# Patient Record
Sex: Female | Born: 2000 | Race: Black or African American | Hispanic: No | Marital: Single | Smoking: Never smoker
Health system: Southern US, Community
[De-identification: ages and names within clinical notes are randomized; demographics above are authoritative.]

## PROBLEM LIST (undated history)

## (undated) DIAGNOSIS — J45909 Unspecified asthma, uncomplicated: Secondary | ICD-10-CM

## (undated) HISTORY — PX: OVARIAN CYST REMOVAL: SHX89

---

## 2021-10-15 ENCOUNTER — Other Ambulatory Visit: Payer: Self-pay

## 2021-10-15 ENCOUNTER — Encounter (HOSPITAL_BASED_OUTPATIENT_CLINIC_OR_DEPARTMENT_OTHER): Payer: Self-pay | Admitting: Emergency Medicine

## 2021-10-15 ENCOUNTER — Emergency Department (HOSPITAL_BASED_OUTPATIENT_CLINIC_OR_DEPARTMENT_OTHER): Payer: Medicaid Other

## 2021-10-15 ENCOUNTER — Emergency Department (HOSPITAL_BASED_OUTPATIENT_CLINIC_OR_DEPARTMENT_OTHER)
Admission: EM | Admit: 2021-10-15 | Discharge: 2021-10-15 | Disposition: A | Discharge status: Home / Self Care | Payer: Medicaid Other | Attending: Emergency Medicine | Admitting: Emergency Medicine

## 2021-10-15 DIAGNOSIS — W19XXXA Unspecified fall, initial encounter: Secondary | ICD-10-CM | POA: Insufficient documentation

## 2021-10-15 DIAGNOSIS — S4991XA Unspecified injury of right shoulder and upper arm, initial encounter: Secondary | ICD-10-CM | POA: Diagnosis present

## 2021-10-15 DIAGNOSIS — Y9368 Activity, volleyball (beach) (court): Secondary | ICD-10-CM | POA: Insufficient documentation

## 2021-10-15 DIAGNOSIS — S46911A Strain of unspecified muscle, fascia and tendon at shoulder and upper arm level, right arm, initial encounter: Secondary | ICD-10-CM | POA: Insufficient documentation

## 2021-10-15 DIAGNOSIS — Y92219 Unspecified school as the place of occurrence of the external cause: Secondary | ICD-10-CM | POA: Insufficient documentation

## 2021-10-15 HISTORY — DX: Unspecified asthma, uncomplicated: J45.909

## 2021-10-15 MED ORDER — NAPROXEN 375 MG PO TABS: 375.0000 mg | ORAL_TABLET | Freq: Two times a day (BID) | ORAL | 0 refills | Status: AC

## 2021-10-15 MED ORDER — HYDROCODONE-ACETAMINOPHEN 5-325 MG PO TABS
1.0000 | ORAL_TABLET | Freq: Once | ORAL | Status: AC
  Administered 2021-10-15: 1 via ORAL
  Filled 2021-10-15: qty 1

## 2021-10-15 MED ORDER — HYDROCODONE-ACETAMINOPHEN 5-325 MG PO TABS: 1.0000 | ORAL_TABLET | Freq: Four times a day (QID) | ORAL | 0 refills | Status: AC | PRN

## 2021-10-15 NOTE — Discharge Instructions (Addendum)
Your x-ray did not show any evidence of acute fracture or dislocation.  You likely have a shoulder strain.  You received a sling in the emergency room.  You can use this for comfort.  However I do recommend that you take it out several times a day and do range of motion exercises.  I have also given you a referral to sports medicine.  I have given you a few doses of pain medication however I recommend you primarily use naproxen the anti-inflammatory medication as well as icing the area.  If you have any worsening or concerning symptoms you can return for evaluation otherwise follow-up with the sports med doc and your primary care provider as needed.

## 2021-10-15 NOTE — ED Notes (Signed)
Pt A&OX4 ambulatory at d/c with independent steady gait. Pt verbalized understanding of d/c instructions, prescriptions and follow up care. D/C instructions printed without referral info for sports medicine doctor, was told it needed to be reprinted to give her the information but pt declined stating she didn't need it.

## 2021-10-15 NOTE — ED Notes (Signed)
Pt called out for pain medication. EDP informed.

## 2021-10-15 NOTE — ED Provider Notes (Signed)
[facility] EMERGENCY DEPARTMENT Provider Note   CSN: [id number] Arrival date & time: 10/15/21  2100     History  Chief Complaint  Patient presents with   Shoulder Injury    Regina Frye is a 21 y.o. female.  21 year old female presents today for evaluation of right shoulder pain following falling Friday.  [place].  States severe pain since then.  Has been taking Tylenol with no relief.  Has history of injury to the same shoulder when playing volleyball in school.  The history is provided by the patient. No language interpreter was used.      Home Medications Prior to Admission medications   Medication Sig Start Date End Date Taking? Authorizing Provider  HYDROcodone-acetaminophen (NORCO/VICODIN) 5-325 MG tablet Take 1 tablet by mouth every 6 (six) hours as needed. 10/15/21  Yes Charleene Callegari, PA-C  naproxen (NAPROSYN) 375 MG tablet Take 1 tablet (375 mg total) by mouth 2 (two) times daily. 10/15/21  Yes Marita Kansas, PA-C      Allergies    Patient has no known allergies.    Review of Systems   Review of Systems  Constitutional:  Negative for fever.  Musculoskeletal:  Positive for arthralgias. Negative for joint swelling.  Skin:  Negative for wound.  All other systems reviewed and are negative.  Physical Exam Updated Vital Signs BP 113/73 (BP Location: Left Arm)   Pulse (!) 104   Temp 98.6 F (37 C) (Oral)   Resp 16   Ht 5\' 6"  (1.676 m)   Wt 66.8 kg   SpO2 98%   BMI 23.77 kg/m  Physical Exam Vitals and nursing note reviewed.  Constitutional:      General: She is not in acute distress.    Appearance: Normal appearance. She is not ill-appearing.  HENT:     Head: Normocephalic and atraumatic.     Nose: Nose normal.  Eyes:     Conjunctiva/sclera: Conjunctivae normal.  Pulmonary:     Effort: Pulmonary effort is normal. No respiratory distress.  Musculoskeletal:        General: No deformity.     Comments: Diffuse right  shoulder tenderness to palpation present.  Limited range of motion secondary to pain.  Without deformity or swelling.  2+ radial pulse present.  Sensation intact.  Full range of motion in the elbow and wrist.  Cervical spine without tenderness to palpation.  Skin:    Findings: No rash.  Neurological:     Mental Status: She is alert.    ED Results / Procedures / Treatments   Labs (all labs ordered are listed, but only abnormal results are displayed) Labs Reviewed - No data to display  EKG None  Radiology DG Shoulder Right  Result Date: 10/15/2021 CLINICAL DATA:  Right shoulder injury.  Fall. EXAM: RIGHT SHOULDER - 2+ VIEW COMPARISON:  None Available. FINDINGS: There is no evidence of fracture or dislocation. There is no evidence of arthropathy or other focal bone abnormality. Soft tissues are unremarkable. IMPRESSION: Negative. Electronically Signed   By: 10/17/2021 M.D.   On: 10/15/2021 21:39    Procedures Procedures    Medications Ordered in ED Medications  HYDROcodone-acetaminophen (NORCO/VICODIN) 5-325 MG per tablet 1 tablet (1 tablet Oral Given 10/15/21 2251)    ED Course/ Medical Decision Making/ A&P                           Medical  Decision Making Amount and/or Complexity of Data Reviewed Radiology: ordered.  Risk Prescription drug management.   21 year old female presents today for evaluation of right shoulder pain following a fall that occurred on Friday.  X-ray without evidence of fracture.  She likely has a muscle strain.  She has severe pain on exam.  Limited range of motion secondary to pain.  Sling provided.  Pain medication provided.  Return precautions discussed.  Sports medicine referral provided.   Final Clinical Impression(s) / ED Diagnoses Final diagnoses:  Strain of right shoulder, initial encounter    Rx / DC Orders ED Discharge Orders          Ordered    HYDROcodone-acetaminophen (NORCO/VICODIN) 5-325 MG tablet  Every 6 hours PRN         10/15/21 2316    naproxen (NAPROSYN) 375 MG tablet  2 times daily        10/15/21 2316              Marita Kansas, PA-C 10/15/21 2319    Tegeler, Canary Brim, MD 10/16/21 [phone]

## 2021-10-15 NOTE — ED Notes (Signed)
Sling placed onto patient's right arm

## 2021-10-15 NOTE — ED Triage Notes (Signed)
Patient reports she fell on her right shoulder, c/o pain with movement.

## 2021-10-31 ENCOUNTER — Emergency Department (HOSPITAL_BASED_OUTPATIENT_CLINIC_OR_DEPARTMENT_OTHER)
Admission: EM | Admit: 2021-10-31 | Discharge: 2021-10-31 | Discharge status: Against Medical Advice | Payer: Medicaid Other | Attending: Emergency Medicine | Admitting: Emergency Medicine

## 2021-10-31 ENCOUNTER — Encounter (HOSPITAL_BASED_OUTPATIENT_CLINIC_OR_DEPARTMENT_OTHER): Payer: Self-pay | Admitting: Emergency Medicine

## 2021-10-31 DIAGNOSIS — Z5321 Procedure and treatment not carried out due to patient leaving prior to being seen by health care provider: Secondary | ICD-10-CM | POA: Insufficient documentation

## 2021-10-31 DIAGNOSIS — R42 Dizziness and giddiness: Secondary | ICD-10-CM | POA: Insufficient documentation

## 2021-10-31 DIAGNOSIS — N939 Abnormal uterine and vaginal bleeding, unspecified: Secondary | ICD-10-CM | POA: Diagnosis not present

## 2021-10-31 DIAGNOSIS — R1030 Lower abdominal pain, unspecified: Secondary | ICD-10-CM | POA: Diagnosis present

## 2021-10-31 LAB — URINALYSIS, MICROSCOPIC (REFLEX)

## 2021-10-31 LAB — LIPASE, BLOOD: Lipase: 71 U/L — ABNORMAL HIGH (ref 11–51)

## 2021-10-31 LAB — PREGNANCY, URINE: Preg Test, Ur: NEGATIVE

## 2021-10-31 LAB — CBC
HCT: 38.8 % (ref 36.0–46.0)
Hemoglobin: 13.5 g/dL (ref 12.0–15.0)
MCH: 31.5 pg (ref 26.0–34.0)
MCHC: 34.8 g/dL (ref 30.0–36.0)
MCV: 90.4 fL (ref 80.0–100.0)
Platelets: 242 10*3/uL (ref 150–400)
RBC: 4.29 MIL/uL (ref 3.87–5.11)
RDW: 12.6 % (ref 11.5–15.5)
WBC: 5.7 10*3/uL (ref 4.0–10.5)
nRBC: 0 % (ref 0.0–0.2)

## 2021-10-31 LAB — COMPREHENSIVE METABOLIC PANEL
ALT: 30 U/L (ref 0–44)
AST: 24 U/L (ref 15–41)
Albumin: 4.2 g/dL (ref 3.5–5.0)
Alkaline Phosphatase: 54 U/L (ref 38–126)
Anion gap: 5 (ref 5–15)
BUN: 12 mg/dL (ref 6–20)
CO2: 23 mmol/L (ref 22–32)
Calcium: 9.2 mg/dL (ref 8.9–10.3)
Chloride: 111 mmol/L (ref 98–111)
Creatinine, Ser: 0.94 mg/dL (ref 0.44–1.00)
GFR, Estimated: >60 mL/min (ref >60)
Glucose, Bld: 94 mg/dL (ref 70–99)
Potassium: 3.8 mmol/L (ref 3.5–5.1)
Sodium: 139 mmol/L (ref 135–145)
Total Bilirubin: 0.6 mg/dL (ref 0.3–1.2)
Total Protein: 7.4 g/dL (ref 6.5–8.1)

## 2021-10-31 LAB — URINALYSIS, ROUTINE W REFLEX MICROSCOPIC
Bilirubin Urine: NEGATIVE
Glucose, UA: NEGATIVE mg/dL
Ketones, ur: NEGATIVE mg/dL
Leukocytes,Ua: NEGATIVE
Nitrite: NEGATIVE
Protein, ur: NEGATIVE mg/dL
Specific Gravity, Urine: 1.025 (ref 1.005–1.030)
pH: 6.5 (ref 5.0–8.0)

## 2021-10-31 NOTE — ED Triage Notes (Signed)
Pt arrives pov, steady gait, c/o lower abdominal pain with n/v since last night. Felt better upon waking, then returned after nap pta. Also reports vaginal bleeding. Denies fever, reports being light headed

## 2021-10-31 NOTE — ED Notes (Signed)
Patient approached nurses station and stated she was leaving due to extended wait time. RN informed pt the PA had signed up for her and should be in the room shortly. RN encouraged pt to stay and been seen. Pt persistent about wanting to leave. RN informed pt to return if sx worsen. Pt verbalized understanding. Pt ambulatory to ED exit with steady gait. NAD.

## 2022-01-17 ENCOUNTER — Emergency Department (HOSPITAL_BASED_OUTPATIENT_CLINIC_OR_DEPARTMENT_OTHER): Payer: Medicaid Other

## 2022-01-17 ENCOUNTER — Emergency Department (HOSPITAL_BASED_OUTPATIENT_CLINIC_OR_DEPARTMENT_OTHER)
Admission: EM | Admit: 2022-01-17 | Discharge: 2022-01-17 | Disposition: A | Discharge status: Home / Self Care | Payer: Medicaid Other | Attending: Emergency Medicine | Admitting: Emergency Medicine

## 2022-01-17 ENCOUNTER — Encounter (HOSPITAL_BASED_OUTPATIENT_CLINIC_OR_DEPARTMENT_OTHER): Payer: Self-pay

## 2022-01-17 ENCOUNTER — Other Ambulatory Visit: Payer: Self-pay

## 2022-01-17 DIAGNOSIS — G8929 Other chronic pain: Secondary | ICD-10-CM | POA: Insufficient documentation

## 2022-01-17 DIAGNOSIS — J45909 Unspecified asthma, uncomplicated: Secondary | ICD-10-CM | POA: Insufficient documentation

## 2022-01-17 DIAGNOSIS — R519 Headache, unspecified: Secondary | ICD-10-CM | POA: Diagnosis not present

## 2022-01-17 DIAGNOSIS — R197 Diarrhea, unspecified: Secondary | ICD-10-CM | POA: Insufficient documentation

## 2022-01-17 DIAGNOSIS — Z20822 Contact with and (suspected) exposure to covid-19: Secondary | ICD-10-CM | POA: Diagnosis not present

## 2022-01-17 DIAGNOSIS — G8921 Chronic pain due to trauma: Secondary | ICD-10-CM

## 2022-01-17 DIAGNOSIS — R112 Nausea with vomiting, unspecified: Secondary | ICD-10-CM | POA: Insufficient documentation

## 2022-01-17 LAB — URINALYSIS, ROUTINE W REFLEX MICROSCOPIC
Bilirubin Urine: NEGATIVE
Glucose, UA: NEGATIVE mg/dL
Hgb urine dipstick: NEGATIVE
Ketones, ur: NEGATIVE mg/dL
Leukocytes,Ua: NEGATIVE
Nitrite: NEGATIVE
Protein, ur: NEGATIVE mg/dL
Specific Gravity, Urine: 1.02 (ref 1.005–1.030)
pH: 7 (ref 5.0–8.0)

## 2022-01-17 LAB — COMPREHENSIVE METABOLIC PANEL
ALT: 18 U/L (ref 0–44)
AST: 22 U/L (ref 15–41)
Albumin: 4.1 g/dL (ref 3.5–5.0)
Alkaline Phosphatase: 54 U/L (ref 38–126)
Anion gap: 6 (ref 5–15)
BUN: 10 mg/dL (ref 6–20)
CO2: 25 mmol/L (ref 22–32)
Calcium: 9.4 mg/dL (ref 8.9–10.3)
Chloride: 111 mmol/L (ref 98–111)
Creatinine, Ser: 0.86 mg/dL (ref 0.44–1.00)
GFR, Estimated: >60 mL/min (ref >60)
Glucose, Bld: 80 mg/dL (ref 70–99)
Potassium: 4 mmol/L (ref 3.5–5.1)
Sodium: 142 mmol/L (ref 135–145)
Total Bilirubin: 0.4 mg/dL (ref 0.3–1.2)
Total Protein: 7 g/dL (ref 6.5–8.1)

## 2022-01-17 LAB — CBC
HCT: 39.3 % (ref 36.0–46.0)
Hemoglobin: 13.3 g/dL (ref 12.0–15.0)
MCH: 30.9 pg (ref 26.0–34.0)
MCHC: 33.8 g/dL (ref 30.0–36.0)
MCV: 91.4 fL (ref 80.0–100.0)
Platelets: 236 10*3/uL (ref 150–400)
RBC: 4.3 MIL/uL (ref 3.87–5.11)
RDW: 13.1 % (ref 11.5–15.5)
WBC: 5.4 10*3/uL (ref 4.0–10.5)
nRBC: 0 % (ref 0.0–0.2)

## 2022-01-17 LAB — SARS CORONAVIRUS 2 BY RT PCR: SARS Coronavirus 2 by RT PCR: NEGATIVE

## 2022-01-17 LAB — PREGNANCY, URINE: Preg Test, Ur: NEGATIVE

## 2022-01-17 LAB — LIPASE, BLOOD: Lipase: 45 U/L (ref 11–51)

## 2022-01-17 MED ORDER — ONDANSETRON HCL 4 MG/2ML IJ SOLN
4.0000 mg | Freq: Once | INTRAMUSCULAR | Status: AC
Start: 2022-01-17 — End: 2022-01-17
  Administered 2022-01-17: 4 mg via INTRAVENOUS
  Filled 2022-01-17: qty 2

## 2022-01-17 MED ORDER — SODIUM CHLORIDE 0.9 % IV SOLN: INTRAVENOUS | Status: DC

## 2022-01-17 MED ORDER — SODIUM CHLORIDE 0.9 % IV BOLUS
1000.0000 mL | Freq: Once | INTRAVENOUS | Status: AC
  Administered 2022-01-17: 1000 mL via INTRAVENOUS

## 2022-01-17 MED ORDER — HYDROMORPHONE HCL 1 MG/ML IJ SOLN
1.0000 mg | Freq: Once | INTRAMUSCULAR | Status: AC
  Administered 2022-01-17: 1 mg via INTRAVENOUS
  Filled 2022-01-17: qty 1
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
## 2022-01-17 NOTE — ED Notes (Signed)
Patient reported left side pain  RN notified

## 2022-01-17 NOTE — Discharge Instructions (Signed)
Follow-up back up with your OB/GYN.  Also follow-up with neurology as scheduled.  Today's labs without any significant abnormality which is very reassuring.  COVID testing done followed up on MyChart.  As we discussed your symptoms are not really related to COVID but sometimes people have strange symptoms or other asymptomatic with it.  Work note provided.
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
## 2022-10-29 ENCOUNTER — Other Ambulatory Visit: Payer: Self-pay

## 2022-10-29 ENCOUNTER — Emergency Department (HOSPITAL_BASED_OUTPATIENT_CLINIC_OR_DEPARTMENT_OTHER)
Admission: EM | Admit: 2022-10-29 | Discharge: 2022-10-29 | Discharge status: Against Medical Advice | Payer: 59 | Attending: Emergency Medicine | Admitting: Emergency Medicine

## 2022-10-29 ENCOUNTER — Encounter (HOSPITAL_BASED_OUTPATIENT_CLINIC_OR_DEPARTMENT_OTHER): Payer: Self-pay

## 2022-10-29 DIAGNOSIS — R109 Unspecified abdominal pain: Secondary | ICD-10-CM | POA: Diagnosis present

## 2022-10-29 DIAGNOSIS — Z5321 Procedure and treatment not carried out due to patient leaving prior to being seen by health care provider: Secondary | ICD-10-CM | POA: Insufficient documentation

## 2022-10-29 DIAGNOSIS — R519 Headache, unspecified: Secondary | ICD-10-CM | POA: Insufficient documentation

## 2022-10-29 DIAGNOSIS — R42 Dizziness and giddiness: Secondary | ICD-10-CM | POA: Insufficient documentation

## 2022-10-29 DIAGNOSIS — R112 Nausea with vomiting, unspecified: Secondary | ICD-10-CM | POA: Diagnosis not present

## 2022-10-29 NOTE — ED Notes (Signed)
Pt states she was going out to check if her brother was here before we drew her blood, got in the car and left.
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
## 2022-10-29 NOTE — ED Notes (Signed)
PER EMS: pt is reporting headache, nausea and dizziness. She received Zofran at [facility] today and reports it helped with her nausea and vomiting. She left AMA because of the wait time.   BP- 120/82, HR-80, 99% RA, CBG-90

## 2023-03-25 LAB — HM PAP SMEAR
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
## 2023-05-25 NOTE — Telephone Encounter (Signed)
 Patient is aware that someone will call her to schedule an appt for hospital f/u.

## 2023-05-25 NOTE — Telephone Encounter (Signed)
 Patient needs a hospital/ED follow up appointment.  Requested timeframe is as soon as possible.  No availability was found.  Please call with appointment information.  [facility] [place]   Treated qnm:[id number]   Discharge date: ER 01/03   Call back number: [phone]

## 2023-10-12 IMAGING — DX DG SHOULDER 2+V*R*
3 series · 3 of 3 positions shown · non-contrast
Comparison: None Available.

CLINICAL DATA: Right shoulder injury.  Fall.

EXAM:
RIGHT SHOULDER - 2+ VIEW

[shoulder grashey]
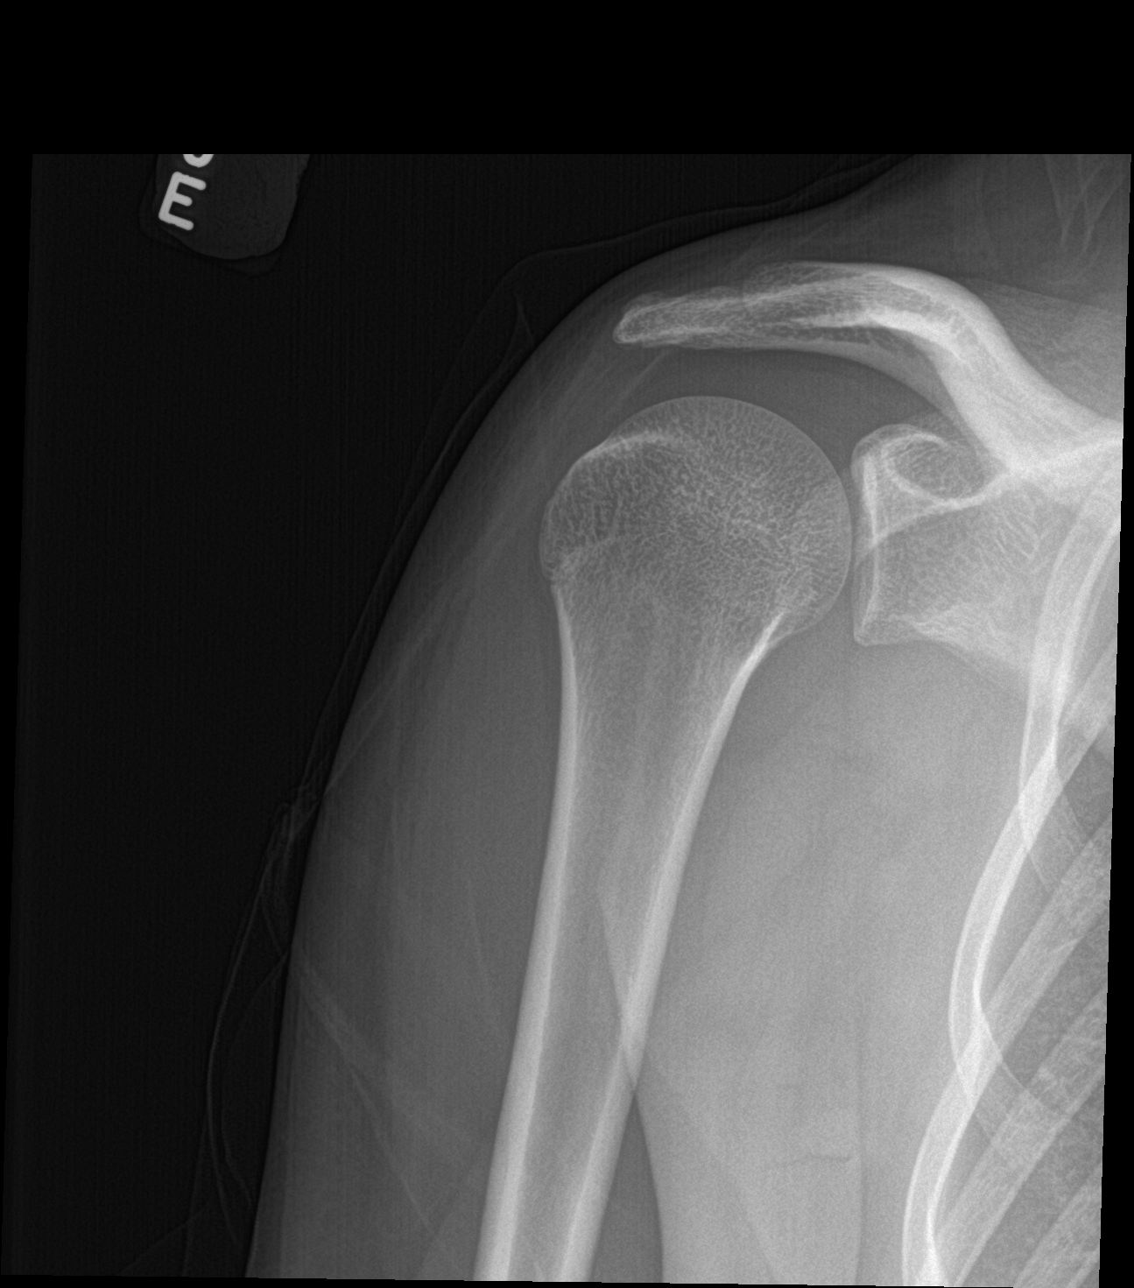

[shoulder y view]
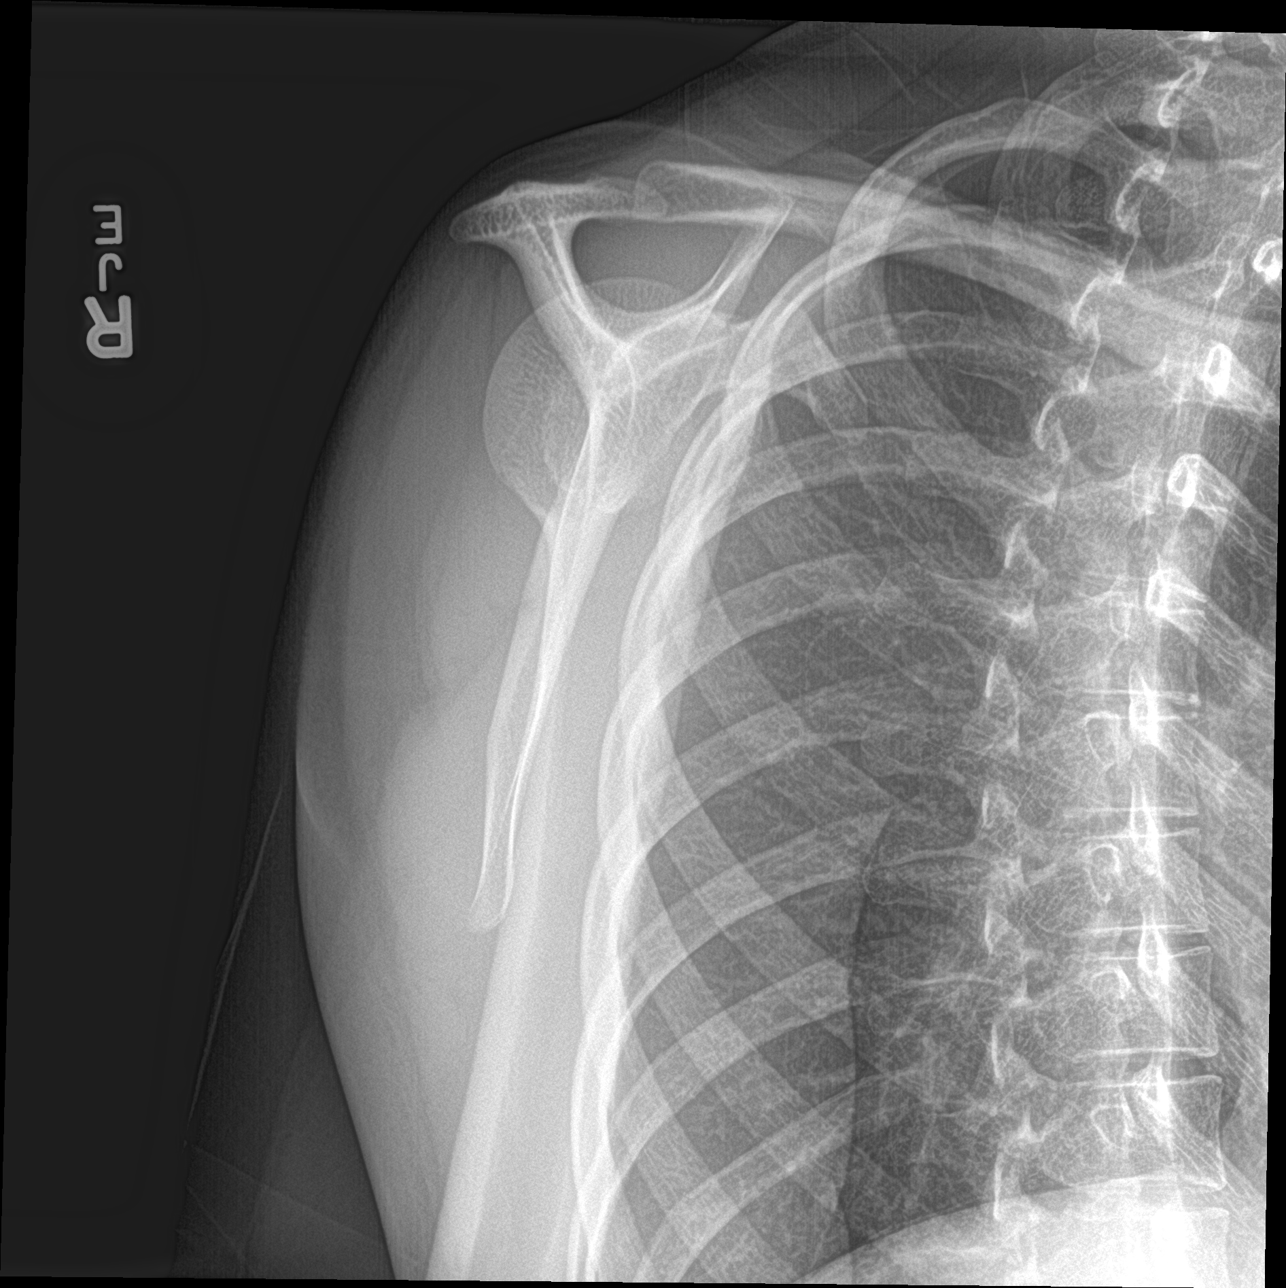

[shoulder ap neutral]
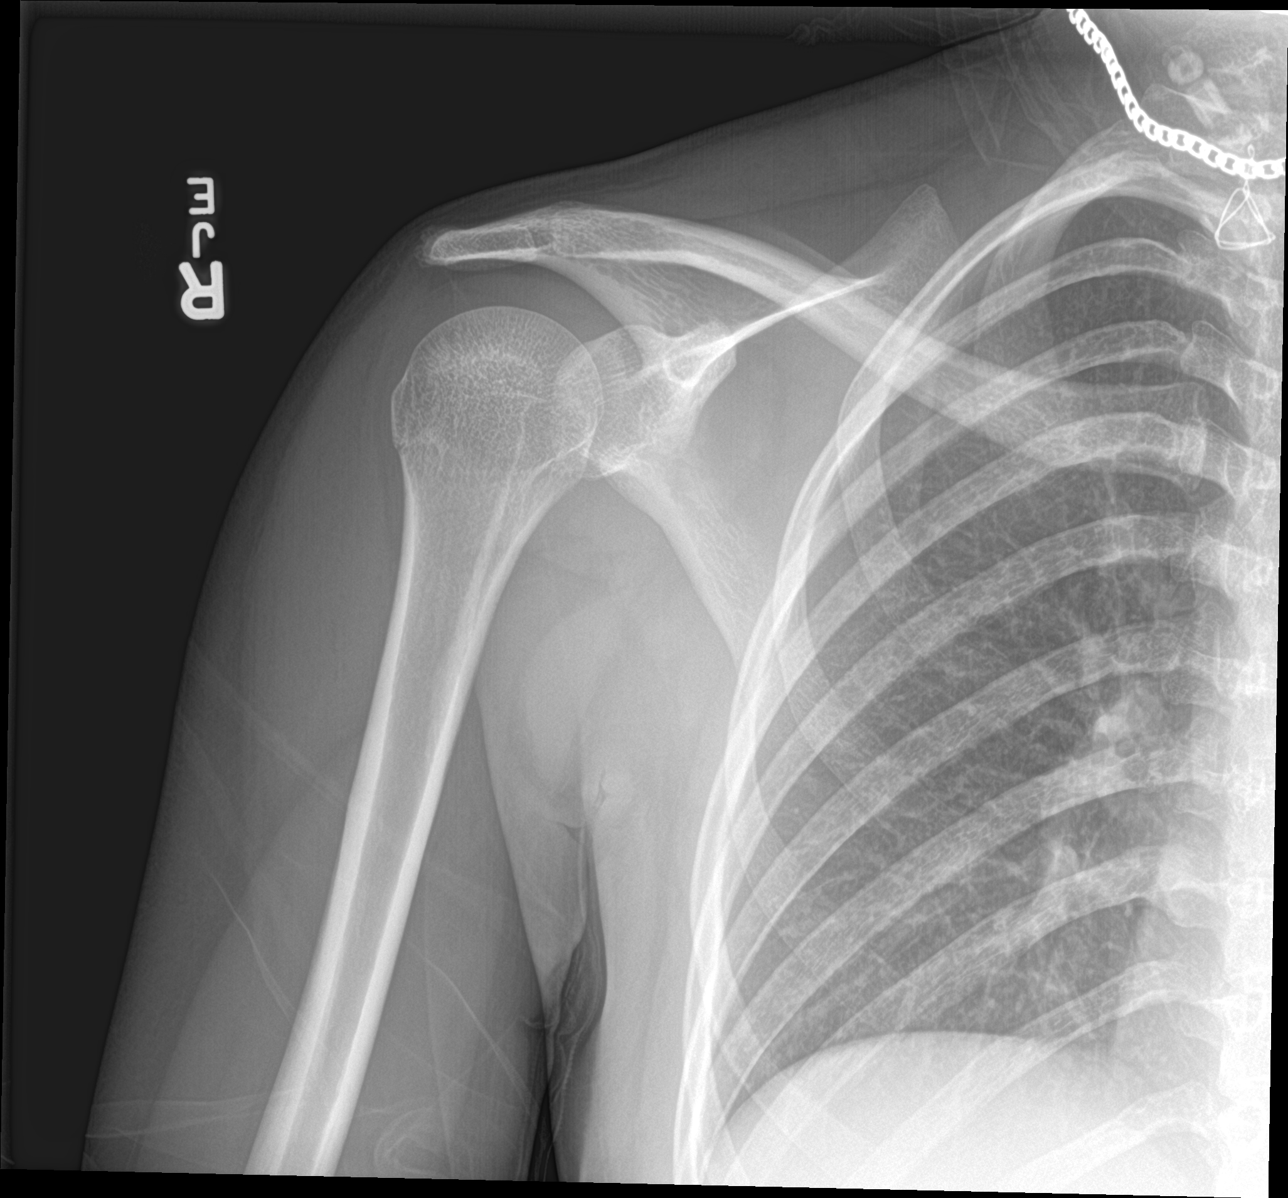

[3 of 3 positions shown; findings below may reference images not displayed]

FINDINGS: There is no evidence of fracture or dislocation. There is no
evidence of arthropathy or other focal bone abnormality. Soft
tissues are unremarkable.
IMPRESSION: Negative.

## 2024-01-10 ENCOUNTER — Emergency Department (HOSPITAL_COMMUNITY)

## 2024-01-10 ENCOUNTER — Encounter (HOSPITAL_COMMUNITY): Payer: Self-pay | Admitting: Emergency Medicine

## 2024-01-10 ENCOUNTER — Other Ambulatory Visit: Payer: Self-pay

## 2024-01-10 ENCOUNTER — Emergency Department (HOSPITAL_COMMUNITY)
Admission: EM | Admit: 2024-01-10 | Discharge: 2024-01-10 | Disposition: A | Discharge status: Home / Self Care | Attending: Emergency Medicine | Admitting: Emergency Medicine

## 2024-01-10 DIAGNOSIS — G43709 Chronic migraine without aura, not intractable, without status migrainosus: Secondary | ICD-10-CM

## 2024-01-10 DIAGNOSIS — G43109 Migraine with aura, not intractable, without status migrainosus: Secondary | ICD-10-CM | POA: Diagnosis not present

## 2024-01-10 DIAGNOSIS — J45909 Unspecified asthma, uncomplicated: Secondary | ICD-10-CM | POA: Diagnosis not present

## 2024-01-10 DIAGNOSIS — G43809 Other migraine, not intractable, without status migrainosus: Secondary | ICD-10-CM | POA: Insufficient documentation

## 2024-01-10 LAB — COMPREHENSIVE METABOLIC PANEL WITH GFR
ALT: 22 U/L (ref 0–44)
AST: 21 U/L (ref 15–41)
Albumin: 4.1 g/dL (ref 3.5–5.0)
Alkaline Phosphatase: 60 U/L (ref 38–126)
Anion gap: 13 (ref 5–15)
BUN: 12 mg/dL (ref 6–20)
CO2: 20 mmol/L — ABNORMAL LOW (ref 22–32)
Calcium: 9.5 mg/dL (ref 8.9–10.3)
Chloride: 105 mmol/L (ref 98–111)
Creatinine, Ser: 0.97 mg/dL (ref 0.44–1.00)
GFR, Estimated: >60 mL/min (ref >60)
Glucose, Bld: 94 mg/dL (ref 70–99)
Potassium: 3.9 mmol/L (ref 3.5–5.1)
Sodium: 138 mmol/L (ref 135–145)
Total Bilirubin: 0.6 mg/dL (ref 0.0–1.2)
Total Protein: 7.1 g/dL (ref 6.5–8.1)

## 2024-01-10 LAB — I-STAT CHEM 8, ED
BUN: 15 mg/dL (ref 6–20)
Calcium, Ion: 1.22 mmol/L (ref 1.15–1.40)
Chloride: 105 mmol/L (ref 98–111)
Creatinine, Ser: 0.9 mg/dL (ref 0.44–1.00)
Glucose, Bld: 90 mg/dL (ref 70–99)
HCT: 46 % (ref 36.0–46.0)
Hemoglobin: 15.6 g/dL — ABNORMAL HIGH (ref 12.0–15.0)
Potassium: 3.8 mmol/L (ref 3.5–5.1)
Sodium: 139 mmol/L (ref 135–145)
TCO2: 22 mmol/L (ref 22–32)

## 2024-01-10 LAB — PROTIME-INR
INR: 1 (ref 0.8–1.2)
Prothrombin Time: 13.3 s (ref 11.4–15.2)

## 2024-01-10 LAB — DIFFERENTIAL
Abs Immature Granulocytes: 0.04 K/uL (ref 0.00–0.07)
Basophils Absolute: 0 K/uL (ref 0.0–0.1)
Basophils Relative: 0 %
Eosinophils Absolute: 0.2 K/uL (ref 0.0–0.5)
Eosinophils Relative: 2 %
Immature Granulocytes: 0 %
Lymphocytes Relative: 35 %
Lymphs Abs: 3.2 K/uL (ref 0.7–4.0)
Monocytes Absolute: 0.6 K/uL (ref 0.1–1.0)
Monocytes Relative: 7 %
Neutro Abs: 5 K/uL (ref 1.7–7.7)
Neutrophils Relative %: 56 %

## 2024-01-10 LAB — CBG MONITORING, ED: Glucose-Capillary: 100 mg/dL — ABNORMAL HIGH (ref 70–99)

## 2024-01-10 LAB — CBC
HCT: 43.8 % (ref 36.0–46.0)
Hemoglobin: 14.5 g/dL (ref 12.0–15.0)
MCH: 31.4 pg (ref 26.0–34.0)
MCHC: 33.1 g/dL (ref 30.0–36.0)
MCV: 94.8 fL (ref 80.0–100.0)
Platelets: 252 K/uL (ref 150–400)
RBC: 4.62 MIL/uL (ref 3.87–5.11)
RDW: 12.8 % (ref 11.5–15.5)
WBC: 9 K/uL (ref 4.0–10.5)
nRBC: 0 % (ref 0.0–0.2)

## 2024-01-10 LAB — ETHANOL: Alcohol, Ethyl (B): <15 mg/dL (ref <15)

## 2024-01-10 LAB — HCG, SERUM, QUALITATIVE: Preg, Serum: NEGATIVE

## 2024-01-10 LAB — APTT: aPTT: 27 s (ref 24–36)

## 2024-01-10 MED ORDER — KETOROLAC TROMETHAMINE 30 MG/ML IJ SOLN
15.0000 mg | Freq: Once | INTRAMUSCULAR | Status: AC
  Administered 2024-01-10: 15 mg via INTRAVENOUS
  Filled 2024-01-10: qty 1

## 2024-01-10 MED ORDER — SODIUM CHLORIDE 0.9 % IV BOLUS
500.0000 mL | Freq: Once | INTRAVENOUS | Status: AC
  Administered 2024-01-10: 500 mL via INTRAVENOUS

## 2024-01-10 MED ORDER — GADOBUTROL 1 MMOL/ML IV SOLN
7.0000 mL | Freq: Once | INTRAVENOUS | Status: AC | PRN
  Administered 2024-01-10: 7 mL via INTRAVENOUS

## 2024-01-10 MED ORDER — DIPHENHYDRAMINE HCL 50 MG/ML IJ SOLN
12.5000 mg | Freq: Once | INTRAMUSCULAR | Status: AC
  Administered 2024-01-10: 12.5 mg via INTRAVENOUS
  Filled 2024-01-10: qty 1

## 2024-01-10 MED ORDER — MAGNESIUM SULFATE 2 GM/50ML IV SOLN
2.0000 g | Freq: Once | INTRAVENOUS | Status: AC
  Administered 2024-01-10: 2 g via INTRAVENOUS
  Filled 2024-01-10: qty 50
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
## 2024-01-10 NOTE — ED Notes (Signed)
 Pt has returned from MRI.

## 2024-01-10 NOTE — ED Provider Notes (Signed)
 [place] EMERGENCY DEPARTMENT AT [facility] Provider Note   CSN: [id number] Arrival date & time: 01/10/24  9962  An emergency department physician performed an initial assessment on this suspected stroke patient at 0034.  Patient presents with: Code Stroke   Regina Frye is a 23 y.o. female.   The history is provided by the patient and the EMS personnel. The history is limited by the condition of the patient.  Cerebrovascular Accident This is a new problem. The current episode started 3 to 5 hours ago. The problem occurs constantly. The problem has not changed since onset.Pertinent negatives include no chest pain, no abdominal pain and no shortness of breath. Associated symptoms comments: Tingling and headache . Nothing aggravates the symptoms. Nothing relieves the symptoms. She has tried nothing for the symptoms. The treatment provided no relief.  No changes in speech,  No weakness. CODE stroke canceled by neurology     Past Medical History:  Diagnosis Date   Asthma      Prior to Admission medications   Medication Sig Start Date End Date Taking? Authorizing Provider  HYDROcodone -acetaminophen  (NORCO/VICODIN) 5-325 MG tablet Take 1 tablet by mouth every 6 (six) hours as needed. 10/15/21   Hildegard Loge, PA-C  naproxen  (NAPROSYN ) 375 MG tablet Take 1 tablet (375 mg total) by mouth 2 (two) times daily. 10/15/21   Hildegard Loge, PA-C    Allergies: Patient has no known allergies.    Review of Systems  Respiratory:  Negative for shortness of breath.   Cardiovascular:  Negative for chest pain.  Gastrointestinal:  Negative for abdominal pain.  Neurological:  Negative for syncope, speech difficulty and weakness.    Updated Vital Signs BP 113/77   Pulse 68   Temp 98.6 F (37 C)   Resp 17   Ht 5' 2 (1.575 m)   Wt 69.4 kg   SpO2 100%   BMI 27.98 kg/m   Physical Exam Vitals and nursing note reviewed.  Constitutional:      General: She is not in acute distress.     Appearance: Normal appearance. She is well-developed.  HENT:     Head: Normocephalic and atraumatic.  Eyes:     Pupils: Pupils are equal, round, and reactive to light.  Cardiovascular:     Rate and Rhythm: Normal rate and regular rhythm.     Pulses: Normal pulses.     Heart sounds: Normal heart sounds.  Pulmonary:     Effort: Pulmonary effort is normal. No respiratory distress.     Breath sounds: Normal breath sounds.  Abdominal:     General: Bowel sounds are normal. There is no distension.     Palpations: Abdomen is soft.     Tenderness: There is no abdominal tenderness. There is no guarding or rebound.  Musculoskeletal:        General: Normal range of motion.     Cervical back: Neck supple.  Skin:    General: Skin is dry.     Capillary Refill: Capillary refill takes less than 2 seconds.     Findings: No erythema or rash.  Neurological:     General: No focal deficit present.     Mental Status: She is oriented to person, place, and time.     Cranial Nerves: No cranial nerve deficit.     Coordination: Coordination normal.     Deep Tendon Reflexes: Reflexes normal.  Psychiatric:        Mood and Affect: Mood normal.     (  all labs ordered are listed, but only abnormal results are displayed) Results for orders placed or performed during the hospital encounter of 01/10/24  CBG monitoring, ED   Collection Time: 01/10/24 12:38 AM  Result Value Ref Range   Glucose-Capillary 100 (H) 70 - 99 mg/dL  Ethanol   Collection Time: 01/10/24 12:41 AM  Result Value Ref Range   Alcohol, Ethyl (B) <15 <15 mg/dL  Protime-INR   Collection Time: 01/10/24 12:41 AM  Result Value Ref Range   Prothrombin Time 13.3 11.4 - 15.2 seconds   INR 1.0 0.8 - 1.2  APTT   Collection Time: 01/10/24 12:41 AM  Result Value Ref Range   aPTT 27 24 - 36 seconds  CBC   Collection Time: 01/10/24 12:41 AM  Result Value Ref Range   WBC 9.0 4.0 - 10.5 K/uL   RBC 4.62 3.87 - 5.11 MIL/uL   Hemoglobin 14.5 12.0  - 15.0 g/dL   HCT 56.1 63.9 - 53.9 %   MCV 94.8 80.0 - 100.0 fL   MCH 31.4 26.0 - 34.0 pg   MCHC 33.1 30.0 - 36.0 g/dL   RDW 87.1 88.4 - 84.4 %   Platelets 252 150 - 400 K/uL   nRBC 0.0 0.0 - 0.2 %  Differential   Collection Time: 01/10/24 12:41 AM  Result Value Ref Range   Neutrophils Relative % 56 %   Neutro Abs 5.0 1.7 - 7.7 K/uL   Lymphocytes Relative 35 %   Lymphs Abs 3.2 0.7 - 4.0 K/uL   Monocytes Relative 7 %   Monocytes Absolute 0.6 0.1 - 1.0 K/uL   Eosinophils Relative 2 %   Eosinophils Absolute 0.2 0.0 - 0.5 K/uL   Basophils Relative 0 %   Basophils Absolute 0.0 0.0 - 0.1 K/uL   Immature Granulocytes 0 %   Abs Immature Granulocytes 0.04 0.00 - 0.07 K/uL  Comprehensive metabolic panel   Collection Time: 01/10/24 12:41 AM  Result Value Ref Range   Sodium 138 135 - 145 mmol/L   Potassium 3.9 3.5 - 5.1 mmol/L   Chloride 105 98 - 111 mmol/L   CO2 20 (L) 22 - 32 mmol/L   Glucose, Bld 94 70 - 99 mg/dL   BUN 12 6 - 20 mg/dL   Creatinine, Ser 9.02 0.44 - 1.00 mg/dL   Calcium 9.5 8.9 - 89.6 mg/dL   Total Protein 7.1 6.5 - 8.1 g/dL   Albumin 4.1 3.5 - 5.0 g/dL   AST 21 15 - 41 U/L   ALT 22 0 - 44 U/L   Alkaline Phosphatase 60 38 - 126 U/L   Total Bilirubin 0.6 0.0 - 1.2 mg/dL   GFR, Estimated >39 >39 mL/min   Anion gap 13 5 - 15  hCG, serum, qualitative   Collection Time: 01/10/24 12:41 AM  Result Value Ref Range   Preg, Serum NEGATIVE NEGATIVE  I-stat chem 8, ED   Collection Time: 01/10/24 12:43 AM  Result Value Ref Range   Sodium 139 135 - 145 mmol/L   Potassium 3.8 3.5 - 5.1 mmol/L   Chloride 105 98 - 111 mmol/L   BUN 15 6 - 20 mg/dL   Creatinine, Ser 9.09 0.44 - 1.00 mg/dL   Glucose, Bld 90 70 - 99 mg/dL   Calcium, Ion 8.77 8.84 - 1.40 mmol/L   TCO2 22 22 - 32 mmol/L   Hemoglobin 15.6 (H) 12.0 - 15.0 g/dL   HCT 53.9 63.9 - 53.9 %   MR Brain W  and Wo Contrast Result Date: 01/10/2024 CLINICAL DATA:  Numbness or tingling, paresthesia (Ped 0-17y) EXAM: MRI  HEAD WITHOUT AND WITH CONTRAST TECHNIQUE: Multiplanar, multiecho pulse sequences of the brain and surrounding structures were obtained without and with intravenous contrast. CONTRAST:  7mL GADAVIST  GADOBUTROL  1 MMOL/ML IV SOLN COMPARISON:  CT head from earlier today. FINDINGS: Brain: No acute infarction, hemorrhage, hydrocephalus, extra-axial collection or mass lesion. A few small T2/FLAIR hyperintensities in the right periatrial white matter and frontal lobes. No abnormal enhancement. Vascular: Normal flow voids. Skull and upper cervical spine: Normal marrow signal. Sinuses/Orbits: Negative. IMPRESSION: 1. No evidence of acute intracranial abnormality. 2. A few small T2/FLAIR hyperintensities in the right periatrial white matter and frontal lobes. These are nonspecific but could be the sequela of chronic microvascular ischemic disease, chronic demyelination, or sequela of chronic migraines. No abnormal enhancement. Electronically Signed   By: Gilmore GORMAN Molt M.D.   On: 01/10/2024 02:33   CT HEAD CODE STROKE WO CONTRAST Result Date: 01/10/2024 CLINICAL DATA:  Code stroke.  Neuro deficit, acute, stroke suspected EXAM: CT HEAD WITHOUT CONTRAST TECHNIQUE: Contiguous axial images were obtained from the base of the skull through the vertex without intravenous contrast. RADIATION DOSE REDUCTION: This exam was performed according to the departmental dose-optimization program which includes automated exposure control, adjustment of the mA and/or kV according to patient size and/or use of iterative reconstruction technique. COMPARISON:  CT head 01/17/2022. FINDINGS: Brain: No evidence of acute infarction, hemorrhage, hydrocephalus, extra-axial collection or mass lesion/mass effect. Mild patchy white matter hypodensities are nonspecific but compatible with chronic microvascular ischemic change. Vascular: No hyperdense vessel identified. Skull: No acute fracture. Sinuses/Orbits: Clear sinuses.  No acute orbital findings.  Other: No mastoid effusions. ASPECTS [facility] Stroke Program Early CT Score) Total score (0-10 with 10 being normal): 10. Other: Attempted call report at the time of dictation. IMPRESSION: 1. No evidence of acute intracranial abnormality. 2. ASPECTS is 10. Electronically Signed   By: Gilmore GORMAN Molt M.D.   On: 01/10/2024 01:05    EKG: EKG Interpretation Date/Time:  Sunday January 10 2024 01:03:29 EDT Ventricular Rate:  65 PR Interval:  162 QRS Duration:  108 QT Interval:  386 QTC Calculation: 402 R Axis:   73  Text Interpretation: Sinus rhythm Confirmed by Nettie, Raef Sprigg ([id number]) on 01/10/2024 1:50:53 AM  Radiology: MR Brain W and Wo Contrast Result Date: 01/10/2024 CLINICAL DATA:  Numbness or tingling, paresthesia (Ped 0-17y) EXAM: MRI HEAD WITHOUT AND WITH CONTRAST TECHNIQUE: Multiplanar, multiecho pulse sequences of the brain and surrounding structures were obtained without and with intravenous contrast. CONTRAST:  7mL GADAVIST  GADOBUTROL  1 MMOL/ML IV SOLN COMPARISON:  CT head from earlier today. FINDINGS: Brain: No acute infarction, hemorrhage, hydrocephalus, extra-axial collection or mass lesion. A few small T2/FLAIR hyperintensities in the right periatrial white matter and frontal lobes. No abnormal enhancement. Vascular: Normal flow voids. Skull and upper cervical spine: Normal marrow signal. Sinuses/Orbits: Negative. IMPRESSION: 1. No evidence of acute intracranial abnormality. 2. A few small T2/FLAIR hyperintensities in the right periatrial white matter and frontal lobes. These are nonspecific but could be the sequela of chronic microvascular ischemic disease, chronic demyelination, or sequela of chronic migraines. No abnormal enhancement. Electronically Signed   By: Gilmore GORMAN Molt M.D.   On: 01/10/2024 02:33   CT HEAD CODE STROKE WO CONTRAST Result Date: 01/10/2024 CLINICAL DATA:  Code stroke.  Neuro deficit, acute, stroke suspected EXAM: CT HEAD WITHOUT CONTRAST TECHNIQUE: Contiguous  axial images were obtained from the base of the  skull through the vertex without intravenous contrast. RADIATION DOSE REDUCTION: This exam was performed according to the departmental dose-optimization program which includes automated exposure control, adjustment of the mA and/or kV according to patient size and/or use of iterative reconstruction technique. COMPARISON:  CT head 01/17/2022. FINDINGS: Brain: No evidence of acute infarction, hemorrhage, hydrocephalus, extra-axial collection or mass lesion/mass effect. Mild patchy white matter hypodensities are nonspecific but compatible with chronic microvascular ischemic change. Vascular: No hyperdense vessel identified. Skull: No acute fracture. Sinuses/Orbits: Clear sinuses.  No acute orbital findings. Other: No mastoid effusions. ASPECTS [facility] Stroke Program Early CT Score) Total score (0-10 with 10 being normal): 10. Other: Attempted call report at the time of dictation. IMPRESSION: 1. No evidence of acute intracranial abnormality. 2. ASPECTS is 10. Electronically Signed   By: Gilmore GORMAN Molt M.D.   On: 01/10/2024 01:05     .Critical Care  Performed by: Nettie Earing, MD Authorized by: Nettie Earing, MD   Critical care provider statement:    Critical care time (minutes):  30   Critical care end time:  01/10/2024 6:02 AM   Critical care was necessary to treat or prevent imminent or life-threatening deterioration of the following conditions:  CNS failure or compromise   Critical care was time spent personally by me on the following activities:  Development of treatment plan with patient or surrogate, discussions with consultants, evaluation of patient's response to treatment, examination of patient, ordering and review of laboratory studies, ordering and review of radiographic studies, ordering and performing treatments and interventions, pulse oximetry, re-evaluation of patient's condition and review of old charts    Medications Ordered in the  ED  magnesium  sulfate IVPB 2 g 50 mL (0 g Intravenous Stopped 01/10/24 0126)  sodium chloride  0.9 % bolus 500 mL (0 mLs Intravenous Stopped 01/10/24 0209)  ketorolac  (TORADOL ) 30 MG/ML injection 15 mg (15 mg Intravenous Given 01/10/24 0213)  diphenhydrAMINE  (BENADRYL ) injection 12.5 mg (12.5 mg Intravenous Given 01/10/24 0213)  gadobutrol  (GADAVIST ) 1 MMOL/ML injection 7 mL (7 mLs Intravenous Contrast Given 01/10/24 0151)                                    Medical Decision Making Per EMS LSN at 1130   Amount and/or Complexity of Data Reviewed Independent Historian: EMS    Details: See above  External Data Reviewed: notes.    Details: Previous notes reviewed  Labs: ordered.    Details: Negative pregnancy test. Normal sodium 138, normal potassium, normal creatinine 0.97 normal white count 9, negative pregnancy test  Radiology: ordered and independent interpretation performed.    Details: Negative CT of the head  ECG/medicine tests: ordered and independent interpretation performed. Decision-making details documented in ED Course. Discussion of management or test interpretation with external provider(s): See By Dr. Sallyann who feels this and MRI are consistent with Migraine. Follow up with neurology as an outpatient   Risk Prescription drug management. Risk Details: Sleeping soundly post medication.  No CVA on MRI.  Have treated for migraine and referred to neurology as an outpatient        Final diagnoses:  Other migraine without status migrainosus, not intractable   No signs of systemic illness or infection. The patient is nontoxic-appearing on exam and vital signs are within normal limits.  I have reviewed the triage vital signs and the nursing notes. Pertinent labs & imaging results that were available during my care  of the patient were reviewed by me and considered in my medical decision making (see chart for details). After history, exam, and medical workup I feel the patient has been  appropriately medically screened and is safe for discharge home. Pertinent diagnoses were discussed with the patient. Patient was given return precautions.  ED Discharge Orders     None          Hamzeh Tall, MD 01/10/24 [phone]

## 2024-01-10 NOTE — ED Notes (Signed)
 Patient transported to MRI

## 2024-01-10 NOTE — ED Triage Notes (Signed)
 Patient arrives via [facility] from home as code stroke. LKW 2330. Symptoms include sudden onset of headache, left sided weakness, left side tingling, and left side weakness. No blood thinners. Dx with concussion 2 weeks ago.

## 2024-01-10 NOTE — Consult Note (Addendum)
 NEUROLOGY CONSULT NOTE   Date of service: January 10, 2024 Patient Name: Regina Frye MRN:  [id number] DOB:  Jan 18, 2001 Chief Complaint: Headache, left-sided tingling & weakness, blurry L eye Requesting Provider: Nettie Earing, MD  History of Present Illness  Regina Frye is a 23 y.o. female with hx of migraines who presents as a code stroke for headache, blurry vision in the left eye, left-sided tingling and weakness.   Patient reports she has a history of migraines, and has had one for a week straight. She does not like to take medication but took a Tylenol  extra strength that did not help. She started having blurry vision when she woke up 01/09/24. The headache and the blurry vision got worse throughout the day, until around 2330 when she felt tingling start around her eye. The tingling then progressed to the rest of the left side of her face and traveled down her body. She reports that the pain is also in the left side of her body now, not just in her head.   LKW: Initially thought to be 2330 01/09/24, however patient confirmed it was evening of 01/08/24 Modified rankin score: 0-Completely asymptomatic and back to baseline post- stroke IV Thrombolysis: No (out of window) EVT: No (too mild to treat, not a stroke) ICH Score: N/A  NIHSS components Score: Comment  1a Level of Conscious 0[x]  1[]  2[]  3[]      1b LOC Questions 0[x]  1[]  2[]       1c LOC Commands 0[x]  1[]  2[]       2 Best Gaze 0[x]  1[]  2[]       3 Visual 0[x]  1[]  2[]  3[]      4 Facial Palsy 0[x]  1[]  2[]  3[]      5a Motor Arm - left 0[x]  1[]  2[]  3[]  4[]  UN[]    5b Motor Arm - Right 0[x]  1[]  2[]  3[]  4[]  UN[]    6a Motor Leg - Left 0[x]  1[]  2[]  3[]  4[]  UN[]    6b Motor Leg - Right 0[x]  1[]  2[]  3[]  4[]  UN[]    7 Limb Ataxia 0[x]  1[]  2[]  UN[]      8 Sensory 0[]  1[x]  2[]  UN[]    Endorses tingling  9 Best Language 0[x]  1[]  2[]  3[]      10 Dysarthria 0[x]  1[]  2[]  UN[]      11 Extinct. and Inattention 0[x]  1[]  2[]       TOTAL: 0      ROS   Comprehensive ROS performed and pertinent positives documented in HPI   Past History   Past Medical History:  Diagnosis Date   Asthma     Past Surgical History:  Procedure Laterality Date   OVARIAN CYST REMOVAL      Family History: History reviewed. No pertinent family history.  Social History  reports that she has never smoked. She has never used smokeless tobacco. She reports that she does not currently use alcohol. She reports current drug use. Drug: Marijuana.  No Known Allergies  Medications   Current Facility-Administered Medications:    diphenhydrAMINE  (BENADRYL ) injection 12.5 mg, 12.5 mg, Intravenous, Once, Palumbo, April, MD   gadobutrol  (GADAVIST ) 1 MMOL/ML injection 7 mL, 7 mL, Intravenous, Once PRN, Palumbo, April, MD   ketorolac  (TORADOL ) 30 MG/ML injection 15 mg, 15 mg, Intravenous, Once, Palumbo, April, MD  Current Outpatient Medications:    HYDROcodone -acetaminophen  (NORCO/VICODIN) 5-325 MG tablet, Take 1 tablet by mouth every 6 (six) hours as needed., Disp: 6 tablet, Rfl: 0   naproxen  (NAPROSYN ) 375 MG tablet, Take 1 tablet (375 mg total) by mouth 2 (  two) times daily., Disp: 20 tablet, Rfl: 0  Vitals   Vitals:   01-27-24 0048 Jan 27, 2024 0100 01/27/2024 0106  BP:  113/77   Pulse:  68   Resp:   17  Temp:   98.6 F (37 C)  SpO2:  100%   Weight: 69.4 kg    Height: 5' 2 (1.575 m)      Body mass index is 27.98 kg/m.   Physical Exam   Constitutional: Appears well-developed and well-nourished.  Psych: Tearful Eyes: No scleral injection.  HENT: No OP obstruction.  Tender to palpation of the left forehead. Head: Normocephalic.  Cardiovascular: Normal rate and regular rhythm.  Respiratory: Effort normal, non-labored breathing.  GI: Soft.  No distension. There is no tenderness.  Skin: WDI.  MSK: Tender to palpation of left-sided body (LE > UE).  Neurologic Examination   Mental status: alert, oriented to person, place and time. Able to provide  history. Speech: no dysarthria, word-finding difficulty, paraphasic errors. Cranial nerves: PERRL EOMI VF full Face sensation intact bilaterally. Face symmetric at rest and with activation. Hearing grossly intact. Palate elevation symmetric Tongue protrudes midline and has full range of motion. SCM's full strength bilaterally. Motor: Normal bulk and tone. No abnormal movements RUE: shoulder abduction 5/5, biceps 5/5, triceps 5/5, wrist flexion 5/5, wrist extension 5/5, hand grip 5/5 LUE: shoulder abduction 5/5, biceps 5/5, triceps 5/5, wrist flexion 5/5, wrist extension 5/5, hand grip 5/5 RLE: hip flexion 5/5, knee flexion 5/5, knee extension 5/5, ankle dorsiflexion 5/5, plantar flexion 5/5 LLE: hip flexion 5/5, knee flexion 5/5, knee extension 5/5, ankle dorsiflexion 5/5, plantar flexion 5/5 Sensory: Grossly intact to light touch throughout, however reports tingling of the left side.  Reflexes: DTR's 2+. Downgoing toes bilaterally. Coordination: FTN intact. HTS intact. Gait: deferred  Labs/Imaging/Neurodiagnostic studies   CBC:  Recent Labs  Lab 27-Jan-2024 0041 2024/01/27 0043  WBC 9.0  --   NEUTROABS 5.0  --   HGB 14.5 15.6*  HCT 43.8 46.0  MCV 94.8  --   PLT 252  --    Basic Metabolic Panel:  Lab Results  Component Value Date   NA 139 01/27/2024   K 3.8 01-27-2024   CO2 20 (L) Jan 27, 2024   GLUCOSE 90 01/27/24   BUN 15 January 27, 2024   CREATININE 0.90 01/27/24   CALCIUM 9.5 Jan 27, 2024   GFRNONAA >60 2024-01-27   Lipid Panel: No results found for: LDLCALC HgbA1c: No results found for: HGBA1C Urine Drug Screen: No results found for: LABOPIA, COCAINSCRNUR, LABBENZ, AMPHETMU, THCU, LABBARB  Alcohol Level     Component Value Date/Time   ETH <15 01-27-2024 0041   INR  Lab Results  Component Value Date   INR 1.0 27-Jan-2024   APTT  Lab Results  Component Value Date   APTT 27 2024-01-27   AED levels: No results found for: PHENYTOIN,  ZONISAMIDE, LAMOTRIGINE, LEVETIRACETA  CT Head without contrast(Personally reviewed): No acute abnormality.   ASSESSMENT   Regina Frye is a 23 y.o. female with history of migraines presenting with episode of left-sided tingling and numbness iso a week-long migraine. No actual weakness on isolated testing, likely secondary to tingling/numbness leading to a heaviness in her left side. Progression of tingling from her face and down her leg not consistent with stroke. Likely initial presentation of hemisensory aura with her migraine. As this is a new feature of a headache, recommended MRI brain w/wo to rule out any structural cause. Imaging showed small T2/FLAIR changes consistent with chronic migraines. Location  of MRI findings & clinical history not consistent with multiple sclerosis.   RECOMMENDATIONS  Migraine cocktail as per ED For migraine prevention, can take magnesium  400mg  and riboflavin 400mg  nightly as patient prefers not to take medication ______________________________________________________________________    Signed, Normie CHRISTELLA Blower, MD [facility]

## 2024-03-08 ENCOUNTER — Other Ambulatory Visit: Payer: Self-pay

## 2024-03-08 ENCOUNTER — Emergency Department (HOSPITAL_COMMUNITY)

## 2024-03-08 ENCOUNTER — Encounter (HOSPITAL_COMMUNITY): Payer: Self-pay

## 2024-03-08 ENCOUNTER — Emergency Department (HOSPITAL_COMMUNITY)
Admission: EM | Admit: 2024-03-08 | Discharge: 2024-03-08 | Disposition: A | Discharge status: Home / Self Care | Attending: Emergency Medicine | Admitting: Emergency Medicine

## 2024-03-08 DIAGNOSIS — J45909 Unspecified asthma, uncomplicated: Secondary | ICD-10-CM | POA: Insufficient documentation

## 2024-03-08 DIAGNOSIS — T7411XA Adult physical abuse, confirmed, initial encounter: Secondary | ICD-10-CM | POA: Diagnosis present

## 2024-03-08 DIAGNOSIS — R109 Unspecified abdominal pain: Secondary | ICD-10-CM | POA: Diagnosis not present

## 2024-03-08 DIAGNOSIS — R519 Headache, unspecified: Secondary | ICD-10-CM | POA: Insufficient documentation

## 2024-03-08 LAB — CBC WITH DIFFERENTIAL/PLATELET
Abs Immature Granulocytes: 0.01 K/uL (ref 0.00–0.07)
Basophils Absolute: 0 K/uL (ref 0.0–0.1)
Basophils Relative: 1 %
Eosinophils Absolute: 0 K/uL (ref 0.0–0.5)
Eosinophils Relative: 1 %
HCT: 40.2 % (ref 36.0–46.0)
Hemoglobin: 13.5 g/dL (ref 12.0–15.0)
Immature Granulocytes: 0 %
Lymphocytes Relative: 21 %
Lymphs Abs: 1.3 K/uL (ref 0.7–4.0)
MCH: 31.3 pg (ref 26.0–34.0)
MCHC: 33.6 g/dL (ref 30.0–36.0)
MCV: 93.1 fL (ref 80.0–100.0)
Monocytes Absolute: 0.4 K/uL (ref 0.1–1.0)
Monocytes Relative: 6 %
Neutro Abs: 4.5 K/uL (ref 1.7–7.7)
Neutrophils Relative %: 71 %
Platelets: 249 K/uL (ref 150–400)
RBC: 4.32 MIL/uL (ref 3.87–5.11)
RDW: 13.1 % (ref 11.5–15.5)
WBC: 6.3 K/uL (ref 4.0–10.5)
nRBC: 0 % (ref 0.0–0.2)

## 2024-03-08 LAB — HCG, SERUM, QUALITATIVE: Preg, Serum: NEGATIVE

## 2024-03-08 LAB — I-STAT CHEM 8, ED
BUN: 11 mg/dL (ref 6–20)
Calcium, Ion: 1.18 mmol/L (ref 1.15–1.40)
Chloride: 107 mmol/L (ref 98–111)
Creatinine, Ser: 1 mg/dL (ref 0.44–1.00)
Glucose, Bld: 103 mg/dL — ABNORMAL HIGH (ref 70–99)
HCT: 41 % (ref 36.0–46.0)
Hemoglobin: 13.9 g/dL (ref 12.0–15.0)
Potassium: 3.8 mmol/L (ref 3.5–5.1)
Sodium: 141 mmol/L (ref 135–145)
TCO2: 22 mmol/L (ref 22–32)

## 2024-03-08 LAB — HEPATIC FUNCTION PANEL
ALT: 21 U/L (ref 0–44)
AST: 27 U/L (ref 15–41)
Albumin: 4.3 g/dL (ref 3.5–5.0)
Alkaline Phosphatase: 65 U/L (ref 38–126)
Bilirubin, Direct: 0.1 mg/dL (ref 0.0–0.2)
Indirect Bilirubin: 0.9 mg/dL (ref 0.3–0.9)
Total Bilirubin: 1 mg/dL (ref 0.0–1.2)
Total Protein: 7.5 g/dL (ref 6.5–8.1)

## 2024-03-08 MED ORDER — IOHEXOL 350 MG/ML SOLN
75.0000 mL | Freq: Once | INTRAVENOUS | Status: AC | PRN
  Administered 2024-03-08: 75 mL via INTRAVENOUS

## 2024-03-08 MED ORDER — FENTANYL CITRATE (PF) 50 MCG/ML IJ SOSY
50.0000 ug | PREFILLED_SYRINGE | Freq: Once | INTRAMUSCULAR | Status: AC
  Administered 2024-03-08: 50 ug via INTRAVENOUS
  Filled 2024-03-08: qty 1

## 2024-03-08 MED ORDER — PROCHLORPERAZINE EDISYLATE 10 MG/2ML IJ SOLN
10.0000 mg | Freq: Once | INTRAMUSCULAR | Status: AC
  Administered 2024-03-08: 10 mg via INTRAVENOUS
  Filled 2024-03-08: qty 2

## 2024-03-08 MED ORDER — ONDANSETRON HCL 4 MG/2ML IJ SOLN
4.0000 mg | Freq: Once | INTRAMUSCULAR | Status: AC
  Administered 2024-03-08: 4 mg via INTRAVENOUS
  Filled 2024-03-08: qty 2

## 2024-03-08 MED ORDER — DIPHENHYDRAMINE HCL 50 MG/ML IJ SOLN
25.0000 mg | Freq: Once | INTRAMUSCULAR | Status: AC
  Administered 2024-03-08: 25 mg via INTRAVENOUS
  Filled 2024-03-08: qty 1

## 2024-03-08 MED ORDER — ACETAMINOPHEN 500 MG PO TABS
1000.0000 mg | ORAL_TABLET | Freq: Once | ORAL | Status: AC
  Administered 2024-03-08: 1000 mg via ORAL
  Filled 2024-03-08: qty 2
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
## 2024-03-08 NOTE — Discharge Instructions (Addendum)
 No worrisome finding on the CT scans and the x-ray taken today.  Take Tylenol  and ibuprofen as you need to for pain control.  I offered pain medicine but you declined this.  Return for any emergent symptoms.
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
## 2024-03-21 ENCOUNTER — Ambulatory Visit (INDEPENDENT_AMBULATORY_CARE_PROVIDER_SITE_OTHER): Payer: Self-pay | Admitting: Podiatry

## 2024-03-21 DIAGNOSIS — M79672 Pain in left foot: Secondary | ICD-10-CM

## 2024-03-21 DIAGNOSIS — M5417 Radiculopathy, lumbosacral region: Secondary | ICD-10-CM

## 2024-03-21 DIAGNOSIS — M7672 Peroneal tendinitis, left leg: Secondary | ICD-10-CM | POA: Diagnosis not present
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
## 2024-03-22 ENCOUNTER — Emergency Department (HOSPITAL_COMMUNITY)

## 2024-03-22 ENCOUNTER — Encounter (HOSPITAL_COMMUNITY): Payer: Self-pay

## 2024-03-22 ENCOUNTER — Other Ambulatory Visit: Payer: Self-pay

## 2024-03-22 ENCOUNTER — Emergency Department (HOSPITAL_COMMUNITY)
Admission: EM | Admit: 2024-03-22 | Discharge: 2024-03-22 | Disposition: A | Discharge status: Home / Self Care | Attending: Emergency Medicine | Admitting: Emergency Medicine

## 2024-03-22 DIAGNOSIS — R1031 Right lower quadrant pain: Secondary | ICD-10-CM | POA: Insufficient documentation

## 2024-03-22 DIAGNOSIS — J45909 Unspecified asthma, uncomplicated: Secondary | ICD-10-CM | POA: Insufficient documentation

## 2024-03-22 LAB — URINALYSIS, ROUTINE W REFLEX MICROSCOPIC
Bacteria, UA: NONE SEEN
Bilirubin Urine: NEGATIVE
Glucose, UA: NEGATIVE mg/dL
Ketones, ur: NEGATIVE mg/dL
Nitrite: NEGATIVE
Protein, ur: NEGATIVE mg/dL
Specific Gravity, Urine: 1.014 (ref 1.005–1.030)
pH: 6 (ref 5.0–8.0)

## 2024-03-22 LAB — CBC
HCT: 41.2 % (ref 36.0–46.0)
Hemoglobin: 13.9 g/dL (ref 12.0–15.0)
MCH: 31 pg (ref 26.0–34.0)
MCHC: 33.7 g/dL (ref 30.0–36.0)
MCV: 91.8 fL (ref 80.0–100.0)
Platelets: 344 K/uL (ref 150–400)
RBC: 4.49 MIL/uL (ref 3.87–5.11)
RDW: 13.5 % (ref 11.5–15.5)
WBC: 7.8 K/uL (ref 4.0–10.5)
nRBC: 0 % (ref 0.0–0.2)

## 2024-03-22 LAB — COMPREHENSIVE METABOLIC PANEL WITH GFR
ALT: 38 U/L (ref 0–44)
AST: 29 U/L (ref 15–41)
Albumin: 3.6 g/dL (ref 3.5–5.0)
Alkaline Phosphatase: 54 U/L (ref 38–126)
Anion gap: 14 (ref 5–15)
BUN: 8 mg/dL (ref 6–20)
CO2: 19 mmol/L — ABNORMAL LOW (ref 22–32)
Calcium: 9.3 mg/dL (ref 8.9–10.3)
Chloride: 107 mmol/L (ref 98–111)
Creatinine, Ser: 0.88 mg/dL (ref 0.44–1.00)
GFR, Estimated: >60 mL/min (ref >60)
Glucose, Bld: 96 mg/dL (ref 70–99)
Potassium: 3.9 mmol/L (ref 3.5–5.1)
Sodium: 140 mmol/L (ref 135–145)
Total Bilirubin: 0.5 mg/dL (ref 0.0–1.2)
Total Protein: 6.8 g/dL (ref 6.5–8.1)

## 2024-03-22 LAB — HCG, SERUM, QUALITATIVE: Preg, Serum: NEGATIVE

## 2024-03-22 LAB — LIPASE, BLOOD: Lipase: 60 U/L — ABNORMAL HIGH (ref 11–51)

## 2024-03-22 MED ORDER — ONDANSETRON HCL 4 MG/2ML IJ SOLN: 4.0000 mg | Freq: Once | INTRAMUSCULAR | Status: DC | PRN

## 2024-03-22 MED ORDER — IOHEXOL 350 MG/ML SOLN
75.0000 mL | Freq: Once | INTRAVENOUS | Status: AC | PRN
  Administered 2024-03-22: 75 mL via INTRAVENOUS

## 2024-03-22 MED ORDER — ONDANSETRON 4 MG PO TBDP: 4.0000 mg | ORAL_TABLET | Freq: Three times a day (TID) | ORAL | 0 refills | Status: DC | PRN

## 2024-03-22 MED ORDER — HYDROMORPHONE HCL 1 MG/ML IJ SOLN
1.0000 mg | Freq: Once | INTRAMUSCULAR | Status: AC
  Administered 2024-03-22: 1 mg via INTRAVENOUS
  Filled 2024-03-22: qty 1

## 2024-03-22 MED ORDER — FENTANYL CITRATE (PF) 50 MCG/ML IJ SOSY
50.0000 ug | PREFILLED_SYRINGE | Freq: Once | INTRAMUSCULAR | Status: AC
  Administered 2024-03-22: 50 ug via INTRAVENOUS
  Filled 2024-03-22: qty 1

## 2024-03-22 MED ORDER — ONDANSETRON HCL 4 MG/2ML IJ SOLN
4.0000 mg | Freq: Once | INTRAMUSCULAR | Status: AC
  Administered 2024-03-22: 4 mg via INTRAVENOUS
  Filled 2024-03-22: qty 2

## 2024-03-22 MED ORDER — DICYCLOMINE HCL 20 MG PO TABS: 20.0000 mg | ORAL_TABLET | Freq: Two times a day (BID) | ORAL | 0 refills | Status: DC

## 2024-03-22 NOTE — ED Notes (Signed)
 Patient transported to Ultrasound

## 2024-03-22 NOTE — Discharge Instructions (Signed)
 CT scan and ultrasound not show any concerning findings.  No cyst on your ovary.  No concerning cause of your symptoms.  Please follow-up with your gynecologist.  Have attached information for 1 above if you do not have 1 currently.  Have also listed a PCP in the area for you to establish care with.  Please return for any emergent symptoms.

## 2024-03-22 NOTE — ED Provider Triage Note (Signed)
 Emergency Medicine Provider Triage Evaluation Note  Regina Frye , a 23 y.o. female  was evaluated in triage.  Pt complains of right lower quadrant pain acute onset last night.  Got worse this morning.  Reminds her of ovarian cyst pain that she had on the left in the past and had that ovary removed.  Patient still has her appendix..  Review of Systems  Positive: Nausea Negative: Vomiting diarrhea, vaginal bleeding  Physical Exam  BP (!) 118/99   Pulse 91   Temp 98.8 F (37.1 C)   Resp (!) 24   Ht 1.702 m (5' 7)   Wt 66.7 kg   SpO2 100%   BMI 23.02 kg/m  Gen:  Awake, no distress uncomfortable due to the abdominal pain Abdomen: Tender right lower quadrant Resp: Normal effort  MSK:  Moves extremities without difficulty  Other:   Medical Decision Making  Medically screening exam initiated at 8:57 AM.  Appropriate orders placed.  Dawne Montour was informed that the remainder of the evaluation will be completed by another provider, this initial triage assessment does not replace that evaluation, and the importance of remaining in the ED until their evaluation is complete.  Clinically most likely right ovarian cyst but with the being in the right lower quadrant need to rule out appendicitis.  Therefore we will initially do a CT scan.  Will give pain medicine and antinausea medicine.  Patient's pregnancy test is pending obviously if she is pregnant and different can set up concerns.   Amber Guthridge, MD 03/22/24 [phone]

## 2024-03-22 NOTE — ED Notes (Signed)
 Pt verbalized understanding of discharge instructions. Opportunity for questions provided.  Pharmacy changed per request.  Pt was in a hurry to be discharged and a full set of d/c vitals could not be obtained.

## 2024-03-22 NOTE — ED Triage Notes (Addendum)
 Pt bib ems from home c.o right lower abd pain, hx of ovarian cyst on left, had surgery last fall to remove it. Pt states the pain started last night and got worse this morning. Pt hysterical in triage. Dry heaves. Pt on Depo, no menstruals.

## 2024-03-22 NOTE — ED Provider Notes (Signed)
 [place] EMERGENCY DEPARTMENT AT [facility] Provider Note   CSN: [id number] Arrival date & time: 03/22/24  9177     Patient presents with: Abdominal Pain   Regina Frye is a 23 y.o. female.   23 year old female presents today for concern of right lower quadrant abdominal pain.  This was acute onset last night.  Got worse this morning.  She had her left ovary taken out due to ovarian cyst.  She states in the past she has had an ovarian cyst on the right but that resolved on its own.  Still has her appendix.  Denies fever.  Some nausea but no vomiting.  Denies vaginal bleeding, dysuria.  Pain is somewhat improved right now from earlier.  The history is provided by the patient. No language interpreter was used.       Prior to Admission medications   Medication Sig Start Date End Date Taking? Authorizing Provider  HYDROcodone -acetaminophen  (NORCO/VICODIN) 5-325 MG tablet Take 1 tablet by mouth every 6 (six) hours as needed. 10/15/21   Hildegard, Arturo Sofranko, PA-C  naproxen  (NAPROSYN ) 375 MG tablet Take 1 tablet (375 mg total) by mouth 2 (two) times daily. 10/15/21   Hildegard Loge, PA-C    Allergies: Patient has no known allergies.    Review of Systems  Updated Vital Signs BP (!) 104/55 (BP Location: Left Arm)   Pulse (!) 58   Temp 98.1 F (36.7 C) (Oral)   Resp 18   Ht 5' 7 (1.702 m)   Wt 66.7 kg   SpO2 100%   BMI 23.02 kg/m   Physical Exam  (all labs ordered are listed, but only abnormal results are displayed) Labs Reviewed  LIPASE, BLOOD - Abnormal; Notable for the following components:      Result Value   Lipase 60 (*)    All other components within normal limits  COMPREHENSIVE METABOLIC PANEL WITH GFR - Abnormal; Notable for the following components:   CO2 19 (*)    All other components within normal limits  URINALYSIS, ROUTINE W REFLEX MICROSCOPIC - Abnormal; Notable for the following components:   Hgb urine dipstick MODERATE (*)    Leukocytes,Ua TRACE (*)    All  other components within normal limits  CBC  HCG, SERUM, QUALITATIVE    EKG: None  Radiology: CT ABDOMEN PELVIS W CONTRAST Result Date: 03/22/2024 CLINICAL DATA:  Right lower quadrant pain.  Recent trauma. EXAM: CT ABDOMEN AND PELVIS WITH CONTRAST TECHNIQUE: Multidetector CT imaging of the abdomen and pelvis was performed using the standard protocol following bolus administration of intravenous contrast. RADIATION DOSE REDUCTION: This exam was performed according to the departmental dose-optimization program which includes automated exposure control, adjustment of the mA and/or kV according to patient size and/or use of iterative reconstruction technique. CONTRAST:  75mL OMNIPAQUE IOHEXOL 350 MG/ML SOLN COMPARISON:  03/08/2024 FINDINGS: Lower chest: Heart is normal size. Visualized lung bases demonstrate subtle peripheral nodular opacification over the posteromedial left lower lobe. Findings could be seen with minimal dependent atelectasis versus early inflammatory/infectious process. Hepatobiliary: Liver, gallbladder and biliary tree are normal. Pancreas: Normal. Spleen: Normal. Adrenals/Urinary Tract: Adrenal glands are normal. Kidneys are normal in size without hydronephrosis or nephrolithiasis. Ureters and bladder are normal. Stomach/Bowel: Stomach and small bowel are normal. Appendix is normal. Colon is unremarkable. Vascular/Lymphatic: Vascular structures are normal.  No adenopathy. Reproductive: Uterus right of midline and otherwise unremarkable. Adnexal regions are within normal. Other: No focal inflammatory change. No free peritoneal air. Small amount of fluid in  the cul-de-sac likely physiologic. Musculoskeletal: No focal abnormality. IMPRESSION: 1. No acute findings in the abdomen/pelvis. 2. Subtle peripheral nodular opacification over the posteromedial left lower lobe which may be due to minimal dependent atelectasis versus early inflammatory/infectious process. Electronically Signed   By:  Toribio Agreste M.D.   On: 03/22/2024 11:24     Procedures   Medications Ordered in the ED  ondansetron  (ZOFRAN ) injection 4 mg (has no administration in time range)  HYDROmorphone  (DILAUDID ) injection 1 mg (1 mg Intravenous Given 03/22/24 0932)  ondansetron  (ZOFRAN ) injection 4 mg (4 mg Intravenous Given 03/22/24 0931)  iohexol (OMNIPAQUE) 350 MG/ML injection 75 mL (75 mLs Intravenous Contrast Given 03/22/24 1013)  HYDROmorphone  (DILAUDID ) injection 1 mg (1 mg Intravenous Given 03/22/24 1113)                                    Medical Decision Making Amount and/or Complexity of Data Reviewed Labs: ordered. Radiology: ordered.  Risk Prescription drug management.   Medical Decision Making / ED Course   This patient presents to the ED for concern of abdominal pain, this involves an extensive number of treatment options, and is a complaint that carries with it a high risk of complications and morbidity.  The differential diagnosis includes appendicitis, ovarian torsion, ovarian cyst, colitis,  MDM: 23 year old female presents today for concern of right lower quadrant abdominal pain.  She is concerned about an ovarian cyst.  Does not have a gynecologist since she moved to [place].  She states she moved from [facility] .  History of left oophorectomy.  Still has her appendix.  CBC unremarkable, CMP without acute concern.  Lipase minimally elevated at 60 without signs or symptoms of pancreatitis.  No evidence of aortitis on CT.  CT without other acute findings.  Some concern for pneumonia but she does not have any symptoms of pneumonia.  No cough, shortness of breath, or pleuritic chest pain.  UA without evidence of UTI.  Hemoglobin present.  Pregnancy test negative.  Will provide pain medicine and obtain ultrasound.  Ultrasound without evidence of torsion or other acute process.  Supportive care discussed.  She will follow-up with her GYN.  Referral given.  Also PCP  referral given as well.  Discharged in stable condition. Return precautions given. Patient voices understanding and is in agreement with plan.   Lab Tests: -I ordered, reviewed, and interpreted labs.   The pertinent results include:   Labs Reviewed  LIPASE, BLOOD - Abnormal; Notable for the following components:      Result Value   Lipase 60 (*)    All other components within normal limits  COMPREHENSIVE METABOLIC PANEL WITH GFR - Abnormal; Notable for the following components:   CO2 19 (*)    All other components within normal limits  URINALYSIS, ROUTINE W REFLEX MICROSCOPIC - Abnormal; Notable for the following components:   Hgb urine dipstick MODERATE (*)    Leukocytes,Ua TRACE (*)    All other components within normal limits  CBC  HCG, SERUM, QUALITATIVE      EKG  EKG Interpretation Date/Time:    Ventricular Rate:    PR Interval:    QRS Duration:    QT Interval:    QTC Calculation:   R Axis:      Text Interpretation:           Imaging Studies ordered: I ordered imaging studies including CT abdomen  pelvis with contrast, pelvic ultrasound I independently visualized and interpreted imaging. I agree with the radiologist interpretation   Medicines ordered and prescription drug management: Meds ordered this encounter  Medications   ondansetron  (ZOFRAN ) injection 4 mg   HYDROmorphone  (DILAUDID ) injection 1 mg   ondansetron  (ZOFRAN ) injection 4 mg   iohexol (OMNIPAQUE) 350 MG/ML injection 75 mL   HYDROmorphone  (DILAUDID ) injection 1 mg   fentaNYL (SUBLIMAZE) injection 50 mcg   dicyclomine (BENTYL) 20 MG tablet    Sig: Take 1 tablet (20 mg total) by mouth 2 (two) times daily.    Dispense:  20 tablet    Refill:  0    Supervising Provider:   CLEOTILDE ROGUE [[id number]]   ondansetron  (ZOFRAN -ODT) 4 MG disintegrating tablet    Sig: Take 1 tablet (4 mg total) by mouth every 8 (eight) hours as needed.    Dispense:  20 tablet    Refill:  0    Supervising Provider:    CLEOTILDE, BRIAN [[id number]]    -I have reviewed the patients home medicines and have made adjustments as needed   Reevaluation: After the interventions noted above, I reevaluated the patient and found that they have :improved  Co morbidities that complicate the patient evaluation  Past Medical History:  Diagnosis Date   Asthma       Dispostion: Discharged in stable condition.  Return precaution discussed.  Patient voices understanding and is in agreement with plan.  Final diagnoses:  Right lower quadrant abdominal pain    ED Discharge Orders          Ordered    dicyclomine (BENTYL) 20 MG tablet  2 times daily        03/22/24 1509    ondansetron  (ZOFRAN -ODT) 4 MG disintegrating tablet  Every 8 hours PRN        03/22/24 1509               Hildegard Loge, PA-C 03/22/24 1511    Patsey Lot, MD 03/22/24 1527

## 2024-03-22 NOTE — ED Notes (Signed)
 ED Provider at bedside.

## 2024-03-22 NOTE — ED Notes (Signed)
 Patient transported to CT

## 2024-03-23 ENCOUNTER — Encounter (HOSPITAL_COMMUNITY): Payer: Self-pay

## 2024-04-01 ENCOUNTER — Other Ambulatory Visit: Payer: Self-pay

## 2024-04-01 ENCOUNTER — Emergency Department (HOSPITAL_COMMUNITY)
Admission: EM | Admit: 2024-04-01 | Discharge: 2024-04-01 | Disposition: A | Discharge status: Home / Self Care | Attending: Emergency Medicine | Admitting: Emergency Medicine

## 2024-04-01 DIAGNOSIS — R1031 Right lower quadrant pain: Secondary | ICD-10-CM | POA: Diagnosis present

## 2024-04-01 DIAGNOSIS — R102 Pelvic and perineal pain unspecified side: Secondary | ICD-10-CM

## 2024-04-01 DIAGNOSIS — N809 Endometriosis, unspecified: Secondary | ICD-10-CM | POA: Diagnosis not present

## 2024-04-01 LAB — RESP PANEL BY RT-PCR (RSV, FLU A&B, COVID)  RVPGX2
Influenza A by PCR: NEGATIVE
Influenza B by PCR: NEGATIVE
Resp Syncytial Virus by PCR: NEGATIVE
SARS Coronavirus 2 by RT PCR: NEGATIVE

## 2024-04-01 LAB — URINALYSIS, ROUTINE W REFLEX MICROSCOPIC
Bilirubin Urine: NEGATIVE
Glucose, UA: NEGATIVE mg/dL
Ketones, ur: NEGATIVE mg/dL
Leukocytes,Ua: NEGATIVE
Nitrite: NEGATIVE
Protein, ur: 100 mg/dL — AB
RBC / HPF: >50 RBC/hpf (ref 0–5)
Specific Gravity, Urine: 1.02 (ref 1.005–1.030)
pH: 6 (ref 5.0–8.0)

## 2024-04-01 LAB — COMPREHENSIVE METABOLIC PANEL WITH GFR
ALT: 22 U/L (ref 0–44)
AST: 21 U/L (ref 15–41)
Albumin: 4.2 g/dL (ref 3.5–5.0)
Alkaline Phosphatase: 58 U/L (ref 38–126)
Anion gap: 11 (ref 5–15)
BUN: 6 mg/dL (ref 6–20)
CO2: 21 mmol/L — ABNORMAL LOW (ref 22–32)
Calcium: 9.6 mg/dL (ref 8.9–10.3)
Chloride: 106 mmol/L (ref 98–111)
Creatinine, Ser: 0.88 mg/dL (ref 0.44–1.00)
GFR, Estimated: >60 mL/min (ref >60)
Glucose, Bld: 92 mg/dL (ref 70–99)
Potassium: 3.9 mmol/L (ref 3.5–5.1)
Sodium: 138 mmol/L (ref 135–145)
Total Bilirubin: 0.9 mg/dL (ref 0.0–1.2)
Total Protein: 7.5 g/dL (ref 6.5–8.1)

## 2024-04-01 LAB — CBC
HCT: 40.3 % (ref 36.0–46.0)
Hemoglobin: 13.8 g/dL (ref 12.0–15.0)
MCH: 31.5 pg (ref 26.0–34.0)
MCHC: 34.2 g/dL (ref 30.0–36.0)
MCV: 92 fL (ref 80.0–100.0)
Platelets: 247 K/uL (ref 150–400)
RBC: 4.38 MIL/uL (ref 3.87–5.11)
RDW: 13.7 % (ref 11.5–15.5)
WBC: 5.7 K/uL (ref 4.0–10.5)
nRBC: 0 % (ref 0.0–0.2)

## 2024-04-01 LAB — LIPASE, BLOOD: Lipase: 40 U/L (ref 11–51)

## 2024-04-01 LAB — HCG, SERUM, QUALITATIVE: Preg, Serum: NEGATIVE

## 2024-04-01 MED ORDER — ONDANSETRON 4 MG PO TBDP
4.0000 mg | ORAL_TABLET | Freq: Three times a day (TID) | ORAL | 0 refills | Status: DC | PRN
Start: 2024-04-01 — End: 2024-04-09

## 2024-04-01 MED ORDER — OXYCODONE-ACETAMINOPHEN 5-325 MG PO TABS: 1.0000 | ORAL_TABLET | Freq: Three times a day (TID) | ORAL | 0 refills | Status: DC | PRN

## 2024-04-01 MED ORDER — HYDROMORPHONE HCL 1 MG/ML IJ SOLN
1.0000 mg | Freq: Once | INTRAMUSCULAR | Status: AC
  Administered 2024-04-01: 1 mg via INTRAVENOUS
  Filled 2024-04-01: qty 1

## 2024-04-01 MED ORDER — ONDANSETRON HCL 4 MG/2ML IJ SOLN
4.0000 mg | Freq: Once | INTRAMUSCULAR | Status: AC
  Administered 2024-04-01: 4 mg via INTRAVENOUS
  Filled 2024-04-01: qty 2

## 2024-04-01 MED ORDER — HYDROMORPHONE HCL 1 MG/ML IJ SOLN
0.5000 mg | Freq: Once | INTRAMUSCULAR | Status: AC
  Administered 2024-04-01: 0.5 mg via INTRAVENOUS
  Filled 2024-04-01: qty 1

## 2024-04-01 MED ORDER — KETOROLAC TROMETHAMINE 30 MG/ML IJ SOLN
30.0000 mg | Freq: Once | INTRAMUSCULAR | Status: AC
  Administered 2024-04-01: 30 mg via INTRAVENOUS
  Filled 2024-04-01: qty 1

## 2024-04-01 NOTE — ED Triage Notes (Signed)
 Pt is coming from home complaining for right lower abdominal pain, with nausa vomiting, and bleeding. Pt is on birth control and bleeding is not normal, pt has a history of ovarian cyst with similar symptoms. Pain is 8/10 radiates down to the left side of her legs, tender to the touch without rigidity.  Vitals 120/70 Hr 110 Rr 14 99% spo2   Temp 98.3 f

## 2024-04-01 NOTE — ED Provider Notes (Signed)
 [place] EMERGENCY DEPARTMENT AT [facility] Provider Note   CSN: [id number] Arrival date & time: 04/01/24  9267     Patient presents with: No chief complaint on file.   Regina Frye is a 23 y.o. female with reported history of endometriosis presenting to ED with complaint of right lower abdominal pain.  Patient reports that this flared up recently.  She says she has nauseated with it.  She has some vaginal bleeding.  She says this is very similar to issues she has had recurring in the past, I have been dealing with this for 4 years.  She has a history of left ovary surgical removal.  She says she has had exploratory surgery in the past with complications as well.  Of note, the patient was seen in the emergency department approximately 10 days ago with complaint of right lower abdominal pain.  At that time she had a pelvic ultrasound and CT imaging with no emergent findings per my review.  Her right ovary had a normal appearance on ultrasound including with Dopplers.   HPI     Prior to Admission medications   Medication Sig Start Date End Date Taking? Authorizing Provider  ondansetron  (ZOFRAN -ODT) 4 MG disintegrating tablet Take 1 tablet (4 mg total) by mouth every 8 (eight) hours as needed for up to 12 doses for nausea or vomiting. 04/01/24  Yes Vika Buske, Donnice PARAS, MD  oxyCODONE-acetaminophen  (PERCOCET/ROXICET) 5-325 MG tablet Take 1 tablet by mouth every 8 (eight) hours as needed for up to 15 doses for severe pain (pain score 7-10). 04/01/24  Yes Winnie Barsky, Donnice PARAS, MD  dicyclomine (BENTYL) 20 MG tablet Take 1 tablet (20 mg total) by mouth 2 (two) times daily. 03/22/24   Hildegard Loge, PA-C  HYDROcodone -acetaminophen  (NORCO/VICODIN) 5-325 MG tablet Take 1 tablet by mouth every 6 (six) hours as needed. 10/15/21   Hildegard, Amjad, PA-C  naproxen  (NAPROSYN ) 375 MG tablet Take 1 tablet (375 mg total) by mouth 2 (two) times daily. 10/15/21   Hildegard Loge, PA-C  ondansetron   (ZOFRAN -ODT) 4 MG disintegrating tablet Take 1 tablet (4 mg total) by mouth every 8 (eight) hours as needed. 03/22/24   Hildegard Loge, PA-C    Allergies: Patient has no known allergies.    Review of Systems  Updated Vital Signs BP 122/89 (BP Location: Right Arm)   Pulse 88   Temp (!) 96.6 F (35.9 C) (Temporal)   Resp 16   Ht 5' 7 (1.702 m)   Wt 66.7 kg   SpO2 100%   BMI 23.03 kg/m   Physical Exam Constitutional:      General: She is in acute distress.  HENT:     Head: Normocephalic and atraumatic.  Eyes:     Conjunctiva/sclera: Conjunctivae normal.     Pupils: Pupils are equal, round, and reactive to light.  Cardiovascular:     Rate and Rhythm: Normal rate and regular rhythm.  Pulmonary:     Effort: Pulmonary effort is normal. No respiratory distress.  Abdominal:     General: There is no distension.     Tenderness: There is no abdominal tenderness.  Skin:    General: Skin is warm and dry.  Neurological:     General: No focal deficit present.     Mental Status: She is alert. Mental status is at baseline.  Psychiatric:        Mood and Affect: Mood normal.        Behavior: Behavior normal.     (  all labs ordered are listed, but only abnormal results are displayed) Labs Reviewed  COMPREHENSIVE METABOLIC PANEL WITH GFR - Abnormal; Notable for the following components:      Result Value   CO2 21 (*)    All other components within normal limits  URINALYSIS, ROUTINE W REFLEX MICROSCOPIC - Abnormal; Notable for the following components:   APPearance HAZY (*)    Hgb urine dipstick LARGE (*)    Protein, ur 100 (*)    Bacteria, UA FEW (*)    All other components within normal limits  RESP PANEL BY RT-PCR (RSV, FLU A&B, COVID)  RVPGX2  CBC  HCG, SERUM, QUALITATIVE  LIPASE, BLOOD    EKG: None  Radiology: No results found.   Procedures   Medications Ordered in the ED  HYDROmorphone  (DILAUDID ) injection 1 mg (1 mg Intravenous Given 04/01/24 1014)  ketorolac   (TORADOL ) 30 MG/ML injection 30 mg (30 mg Intravenous Given 04/01/24 1014)  ondansetron  (ZOFRAN ) injection 4 mg (4 mg Intravenous Given 04/01/24 1015)  HYDROmorphone  (DILAUDID ) injection 1 mg (1 mg Intravenous Given 04/01/24 1132)  HYDROmorphone  (DILAUDID ) injection 0.5 mg (0.5 mg Intravenous Given 04/01/24 1347)  ondansetron  (ZOFRAN ) injection 4 mg (4 mg Intravenous Given 04/01/24 1347)    Clinical Course as of 04/02/24 9374  Fri Apr 01, 2024  1121 Pain still 8/10, requesting additional pain medication [MT]    Clinical Course User Index [MT] Cottie Donnice PARAS, MD                                 Medical Decision Making Amount and/or Complexity of Data Reviewed Labs: ordered.  Risk Prescription drug management.   Patient here complaining of right lower abdominal pain or pelvic pain.  Differential would include endometriosis most likely versus appendicitis versus ruptured ovarian cyst versus mittelschmerz versus other pelvic pathology  Given her history, I most suspect this is consistent with endometriosis.  She has had a very recent and thorough workup in the ED including CT imaging and pelvic ultrasound with no emergent findings for identical presentation.  I do not think she is needing repeat scans at this time and have a low suspicion that this is ovarian torsion.  She is however needing pain control.  IV pain and nausea medications have been ordered.  Labs reviewed, no emergent findings  Pain improved on reassessment  Patient reports she has a new OBGYN appointment locally but has not scheduled it yet, and she would like referral information for other clinics that might see her sooner.  I provided [facility], but I am not certain about the turnaround time and encouraged her to search as well.  I do think her primary pain is likely endometriosis, if she holds this prior diagnosis.  No further workup indicated in the ED at this time.     Final diagnoses:  Endometriosis   Pelvic pain    ED Discharge Orders          Ordered    Ambulatory referral to Obstetrics / Gynecology       Comments: Any available location for establishing care with concern for AUB and endometrial pain   04/01/24 1317    oxyCODONE-acetaminophen  (PERCOCET/ROXICET) 5-325 MG tablet  Every 8 hours PRN        04/01/24 1404    ondansetron  (ZOFRAN -ODT) 4 MG disintegrating tablet  Every 8 hours PRN        04/01/24 1404  Cottie Donnice PARAS, MD 04/02/24 [phone]

## 2024-04-01 NOTE — ED Notes (Signed)
 Paper work and education reviewed with pt. Pt offered wheelchair due to pain, pt decided to walk. Pt walked to lobby for pick up  with friend, pt is in no new onset distress at this time.

## 2024-04-07 NOTE — ED Notes (Signed)
 Pt reports feeling itching and redness to her neck/throat. No urticaria noted to same. MD made aware. Pt states that the last several times (I) got morphine this happened.   Chiquita Almarie Rower, RN 04/07/24 [phone]

## 2024-04-07 NOTE — ED Provider Notes (Signed)
 ------------------------------------------------------------------------------- Attestation signed by Carmelita Keller Mania, MD at 04/08/2024  6:49 AM ED Supervisory Statement:  I have evaluated the patient concurrently with the resident. I agree with the resident's history, physical, medical decision making, and plan as documented.    Plan is oncoming team to follow-up on work-up and dispo accordingly.  -------------------------------------------------------------------------------            Chief Complaint  Patient presents with  . 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  reporting onset of generalized abdominal pain with vaginal bleeding with 2-3 soaked pads per hour.  Reports she has mild pain on a daily basis from her endometriosis but today was more significant.  Reports taking Tylenol  and ibuprofen  without improvement.  Additionally took a Percocet which just made her feel nauseous and had 2-3 episodes of nonbloody emesis.  She is not sexually active.  On Depo for endometriosis. Reports history of UTIs but does not have dysuria. Reports she has not been able to follow-up with a gynecologist since moving from [place].  Besides ex lap she has had no other abdominal surgeries.  Still has her appendix and gallbladder.  Has been previously seen in [facility] ED on 11/4 and 11/14 for similar symptoms.  On 11/4, patient had CT abdomen and pelvic ultrasound which were negative.        Patient History  Medical History[1] Surgical History[2] Family History[3] Social History[4]    Review of Systems  Review of Systems    Physical Exam  ED Triage Vitals  Temp 04/06/24 2001 97 F (36.1 C)  Heart Rate 04/06/24 2001 66  Resp 04/06/24 2001 16  BP 04/06/24 2001 92/74  MAP (mmHg)  04/06/24 2001 82  SpO2 04/06/24 2228 100 %  O2 Device 04/06/24 2228 None (Room air)  O2 Flow Rate (L/min) --   Weight 04/06/24 2001 67.5 kg (148 lb 12.8 oz)   Physical Exam Vitals and nursing note reviewed. Exam conducted with a chaperone present.  Constitutional:      Comments: Patient crying out of pain and expressing frustration.  Cardiovascular:     Rate and Rhythm: Normal rate and regular rhythm.     Heart sounds: No murmur heard. Pulmonary:     Effort: Pulmonary effort is normal. No respiratory distress.     Breath sounds: Normal breath sounds. No wheezing, rhonchi or rales.  Abdominal:     General: Abdomen is flat. Bowel sounds are normal.     Palpations: Abdomen is soft.     Tenderness: There is generalized abdominal tenderness. There is right CVA tenderness, left CVA tenderness and guarding. There is no rebound.     Hernia: No hernia is present.  Genitourinary:    Vagina: Bleeding present. No erythema.     Cervix: Discharge present.     Comments: Dark blood without clots at introitus. White discharge and blood noted on speculum exam Skin:    General: Skin is warm and dry.     Capillary Refill: Capillary refill takes less than 2 seconds.  Neurological:     Mental Status: She is alert and oriented to person, place, and time.        CHA2DS2-VASc Score: N/A  Glasgow Coma Scale Score: 15  Procedures                       ED Course & MDM  ED Course as of 04/07/24 0735  Thu Apr 07, 2024  0732 S. 30F. Endometriosis s/p ex-lap. Gyn to see. Dilaudid  for pelvic.  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   Initial vital signs are unremarkable as patient is afebrile, nontachycardic, normotensive and saturating well on RA.   Differential diagnosis includes but is not limited to: Appendicitis, ovarian torsion, cyst rupture, ectopic pregnancy, endometriosis flare, PID,  UTI, kidney stone, pyelonephritis Patient has a history of endometriosis with frequent flares.  She has been to the ED multiple times for pain.  Last CT and ultrasound were done on 11/4 in [place].  Reporting generalized abdominal pain since noon with increased vaginal bleeding.  Patient does have a history of dermoid cyst and cyst rupture leading to left oophorectomy.  Given her history of cysts with rupture, increase suspicion for a new right ovarian cyst which could be concerning for ovarian torsion or cyst rupture.  Patient still has her appendix and generalized pain could be secondary to perforation.  However patient is afebrile.  Patient denies recent sexual activity so low suspicion for ectopic pregnancy.  On speculum exam, patient had extreme pain with insertion of speculum which increases suspicion for PID. Patient has had UTIs in the past.  Currently denies dysuria but has CVA tenderness on exam.  Did have nausea and vomiting earlier today but likely secondary to opioid use.   CBC without leukocytosis and stable hemoglobin.  CMP without electrolyte abnormalities, AKI or transaminitis.  Bilirubin WNL.  UA with blood and leukocyte esterase likely secondary to vaginal bleeding.  CT abdomen without abnormality.   Gynecology was consulted for further evaluation and recommendations.   Handoff: At the time of sign-out, recommendations from the gynecology team. Handoff was provided to oncoming ED team, please see their note for additional treatment  plan details.  The plan for this patient was with the attending on shift who voiced agreement and oversaw evaluation and treatment.  Regina Banks, MD Emergency Medicine PGY-1 8:26 AM Thu 04/07/2024   Amount and/or Complexity of Data Reviewed Labs: ordered. Radiology: ordered.  Risk Prescription drug management.      ED Disposition:  Data Unavailable Final diagnoses:  None    ED Prescriptions   None           [1] Past Medical History: Diagnosis Date  . Allergic rhinitis   . Allergy   . Amenia   . Anemia   . Asthma (CMD)   . Eczema   [2] Past Surgical History: Procedure Laterality Date  . ABDOMINAL SURGERY  N/A 03/25/2023   LAPAROSCOPIC EXPLORATION performed by Comer Earnie Nicholaus Inga, MD at [facility] OR  . DIAGNOSTIC LAPAROSCOPY N/A 08/29/2020   Procedure: Diagnostic laparoscopy, ovarian cystectomy, peritoneal wall biopsy;  Surgeon: Will Gunner, MD;  Location[facility] MAIN OR;  Service: Gynecology;  Laterality: N/A;  . ENDOMETRIAL ABLATION N/A 03/25/2023   EXCISION Of ENDOMETRIOSIS performed by Comer Earnie Nicholaus Inga, MD at [facility] OR  . OOPHORECTOMY N/A 03/25/2023   LAPAROSCOPIC OOPHORECTOMY performed by Comer Earnie Nicholaus Inga, MD at [facility] OR  . OVARIAN CYST REMOVAL N/A 03/25/2023   CYSTECTOMY OVARIAN performed by Comer Earnie Nicholaus Inga, MD at [facility] OR  . PELVIC LAPAROSCOPY  08/2020   Procedure: PELVIC LAPAROSCOPY  [3] Family History Problem Relation Name Age of Onset  . Hypertension Paternal Grandfather    . Hypertension Maternal Grandmother    . Asthma Maternal Grandmother    . Cancer Maternal Grandmother  77       breast cancer  . Colon cancer Maternal Grandfather    . Hyperlipidemia Maternal Grandfather    . Asthma Father    . Asthma Mother    . Breast cancer Neg Hx    . Ovarian cancer Neg Hx    . Stroke Neg Hx    [4] Social History Tobacco Use  . Smoking status: Never  . Smokeless tobacco: Never  . Tobacco  comments:    Grandmother smokes.  pt states she currentyl vapes  Vaping Use  . Vaping status: Every Day  . Substances: Nicotine, THC, CBD, Flavoring  . Devices: Disposable  Substance Use Topics  . Alcohol use: Yes    Comment: occas  . Drug use: Yes    Types: Marijuana    Comment: Drug use: Drug Use: Yes; DAILY- 3 X DAILY

## 2024-04-07 NOTE — Consults (Signed)
 ------------------------------------------------------------------------------- Attestation signed by Therisa Fitch Cochrane, MD at 04/08/2024  1:59 PM I have reviewed this patient's status including her imaging findings and have reviewed and agree with the below consult H&P by Dr. Lucilla, PGY1. Given that pain is not consistent with endometriosis pain, especially in light of consistent Depo use, favor alternative etiologies of pain including GI. No acute intervention from a gyn standpoint indicated at this time.  Fitch Pagan, MD, [facility], FACOG Assistant Professor Department of Obstetrics and Gynecology  -------------------------------------------------------------------------------  GYNECOLOGY CONSULT H&P  Patient Name: Regina Frye Patient MRN:   [id number]  Chief complaint: Abdominal Pain  Reason for consult: diffuse abdominal pain  ASSESSMENT & PLAN    Regina Frye is a 23 y.o. G0P0000 with PMH of endometriosis s/p excision of endometriosis, lysis of adhesions, and L oophorectomy presenting for increasing abdominal pain.  #History of Endometriosis #Diffuse abdominal pain Patient presents with diffuse abdominal pain. She has been seen for the same complaint in the ED on 11/04 and 11/14, her workup was unremarkable and she was discharged. She presented again due to an increase in the intensity of the pain. She has a history of endometriosis s/p excisional management, lysis of adhesions and is currently managing with Depo which was last given 03/03/24. She also endorses, bloating, constipation, nausea, vomiting, and inability to tolerate PO.  Objective work up finding results below TVUS: unremarkable, preserved vascular flow to the right ovary. L ovary surgically removed.  Pelvic exam: minimal menstrual blood in the vaginal vault, no abnormal discharge no cervical motion tenderness, minimally mobile uterus UPT: negative Wet prep: + clue cells, trichomonas neg ,  yeast neg UDS: THC + 4 months and 7 months ago Hgb:13.0 UA:  brown, cloudy, RBC +, leukocyte esterase positive, nitrite negative. Likely contaminated Assessment- Patient is 23 yo G0P0 with PMH of endometriosis s/p excision of endometriosis, lysis of adhesions, and L oophorectomy presenting for increasing abdominal pain. Abdominal exam positive for diffuse TTP, no guarding, no rebound tenderness, non distended. With her compliance with her Depo regimen (last dose 10/16),  negative UPT, diffuse abdominal pain, TVUS finding of preserved vascular flow to the right ovary, stable hemoglobin, and benign pelvic exam gynecology etiologies  such as R ovarian torsion, endometriosis, PID are less likely. Given her symptoms of coexisting constipation, inability to tolerate PO due to N/V, positive UDS other etiologies of abdominal pain should be considered including cannabis hyperemesis syndrome. Plan Outpatient gynecology follow up has been arranged for patient to be seen for short term estrogen therapy, given the length of her Depo treatment and new bleeding patterns.  Medical co-morbidities: #Left ovarian simple cyst -Patient was taken to OR in 2022 for concern of L dermoid cyst per op note cyst was described as 2 cm simple cyst, with no evidence of dermoid cyst.  -Op note does report powder burn lesions in the anterior cul-de-sac suspicious for endometriosis #Endometriosis -Incidental finding during diagnostic laparoscopy for cyst above.  -Patient was started on OCPs -Underwent excision of endometriosis/ lysis of adhesions/ L oophorectomy in 2024    Disposition: Thank you for this consultation, gynecology will sign off at this time.  We are available via secure chat if any further questions arise.  Thank you for the opportunity to be involved with this patient's care.  We will continue to follow-up as outlined in the plan. Please Secure Chat [facility] Gynecology Inpatient Consults with any questions.    Plan discussed with on-call GYN attending Dr. Pagan.   HISTORY  HPI: Regina Frye is a 23 y.o. G0P0000 who presented for increasing abdominal pain. She has been previously been seen in ED on 11/4 and 11/14 for abdominal pain (unremarkable work up, see notes for more details). Patient re presents due to increasing intensity in pain. She describes the pain as intermittent, diffuse and it feels as if though someone is pulling and tearing from the inside and burning, when she presented it was 10/10 but on MD's evaluation she reported 4/10 because she was lying down with a pillow adding pressure, which she attributes as alleviating factors. Patient reports tylenol , ibuprofen , percocet 5 and oxy 5 have not helped her pain in the past. Recently she has been taking her step mother's percocet 10 and it helped, later in the exam patient reported percocet 10 was not helping anymore. Patient reports bloating that feels like gas, loss of appetite and vomiting, patient reports last solid food intake was two days ago, but she has been drinking clear fluids. Patient also reports dyschezia and constipation. She reports dysuria with abdominal pain but no vaginal symptoms. Patient is in same sex relationship, she has not sexually active in the last two months, she reports she is not the recipient of sexual acts and does not participate in any acts where she is penetrated (digitally or with toys), she denies abnormal discharge.    For her endometriosis, patient reports she has tried OCPs and the patch but discontinued due to breakthrough bleeding, she has been on depo for the last 3-4 years (last dose on 03/03/24) and has not had menses since then. She reports menses began in August. She reports on depo her endometriosis pain was still present but tolerable at around 5/10 intermittently. For her menses she wears medium sized panties, she is not concerned with the amount of bleeding but she is concerned that  she is bleeding again after not having a period for years.    Review of Systems: Constitutional symptoms:  Gastrointestinal:  nausea, vomiting, abdominal pain, constipation, loss of appetite, bloating Genitourinary:  menstrual changes and painful urination ROS otherwise negative  Obstetrical History: OB History     Gravida  0   Para  0   Term  0   Preterm  0   AB  0   Living  0      SAB  0   IAB  0   Ectopic  0   Molar  0   Multiple  0   Live Births  0         Gynecologic History: H/o STDs: Denies H/o abnormal pap:  Denies. Last pap 03/25/2023 LMP: 03/22/2024 Contraception: Depo  Menarche-13  Past Medical History: Medical History[1] Past Surgical History: Surgical History[2] Family History:  Family History[3]  Social History:    Home Medications:  Prior to Admission medications  Medication Sig Start Date End Date Taking? Authorizing Provider  cyclobenzaprine (FLEXERIL) 10 mg tablet Take 1 tablet (10 mg total) by mouth 2 (two) times a day as needed for muscle spasms. 02/09/24   Rosaline Jama Collet, NP  naproxen  (NAPROSYN ) 375 mg tablet Take 1 tablet (375 mg total) by mouth in the morning and 1 tablet (375 mg total) in the evening. Take with meals. 02/09/24 03/10/24  Rosaline Jama Collet, NP   Allergies:  Allergies[4]  OBJECTIVE    Temp:  [97 F (36.1 C)-98.2 F (36.8 C)] 98.1 F (36.7 C) Heart Rate:  [66-102] 72 Resp:  [16-20] 16 BP: (92-122)/(66-75) 96/71  Gen:  well appearing, no acute distress Pulm: respirations unlabored Card: warm, well perfused Abd: soft, non distended, non guarding, no rebound tenderness, tender to palpation in all four quadrants  Ext: no edema, no cyanosis Mental status: A&Ox4, insight intact, affect appropriate Pelvic: Chaperone accompanied provider during entirety of exam.  Normal external female genitalia. No vulvar masses or lesions. Non friable cervix No cervical masses or lesions. No discharge noted. No  cervical motion tenderness. Uterus non mobile and nontender. No adnexal masses appreciated.Patient tolerated exam, but with some discomfort. Scant blood in vaginal vault.  Labs/Studies: Lab Results (last 24 hours)     Procedure Component Value Ref Range Date/Time   Wet Prep [[id number]] Collected: 04/07/24 0733   Lab Status: In process Specimen: Swab from Vagina Updated: 04/07/24 0756   Chlamydia / Gonococcus [facility]), NAAT [[id number]] Collected: 04/07/24 0733   Lab Status: In process Specimen: Genital from Endocervical/Cervical Updated: 04/07/24 0756   Urinalysis with Reflex to Microscopic [[id number]]  (Abnormal) Collected: 04/07/24 0339   Lab Status: Final result Specimen: Urine from Clean Catch Updated: 04/07/24 0427    Color, Urine Delores* Yellow     Clarity, Urine Cloudy* Clear     Specific Gravity, Urine 1.021 1.005 - 1.025     pH, Urine 6.5 5.0 - 8.0     Protein, Urine 20* Negative mg/dL     Glucose, Urine Negative Negative mg/dL     Ketones, Urine Negative Negative mg/dL     Bilirubin, Urine Negative Negative     Blood, Urine 3+* Negative     Nitrite, Urine Negative Negative     Leukocyte Esterase, Urine 75* Negative     Urobilinogen, Urine 2.0* <2.0 mg/dL     WBC, Urine 0-5 <6 /HPF     RBC, Urine >20* 0 - 2 /HPF     Bacteria, Urine Occasional None Seen, Rare /HPF     Squamous Epithelial Cells, Urine 6-10* 0 - 5 /HPF    CBC with Differential [[id number]]  (Abnormal) Collected: 04/06/24 2014   Lab Status: Final result Specimen: Blood from Venous Updated: 04/06/24 2046   Narrative:     The following orders were created for panel order CBC with Differential. Procedure                               Abnormality         Status                    ---------                               -----------         ------                    CBC with Differential[[id number]]       Abnormal            Final result               Please view results for these tests on the individual orders.    Comprehensive Metabolic Panel [[id number]]  (Abnormal) Collected: 04/06/24 2014   Lab Status: Final result Specimen: Blood from Venous Updated: 04/06/24 2108    Sodium 139 136 - 145 mmol/L     Potassium 4.2 3.5 - 5.1 mmol/L     Comment: NO VISIBLE HEMOLYSIS  Chloride 109* 98 - 107 mmol/L     CO2 24 21 - 31 mmol/L     Comment: High lactate dehydrogenase (LDH) concentrations in patient samples may cause falsely increased bicarbonate results. If markedly elevated LDH levels are suspected, please assess results in conjunction with patient's LDH values.      Anion Gap 6 6 - 14 mmol/L     Glucose, Random 83 70 - 99 mg/dL     Blood Urea Nitrogen (BUN) 12 7 - 25 mg/dL     Creatinine 9.27 9.39 - 1.20 mg/dL     eGFR >09 >40 fO/fpw/8.26f7     Comment: GFR estimated by CKD-EPI equations(NKF 2021).   Recommend confirmation of Cr-based eGFR by using Cys-based eGFR and other filtration markers (if applicable) in complex cases and clinical decision-making, as needed.      Albumin 4.3 3.5 - 5.7 g/dL     Total Protein 6.6 6.4 - 8.9 g/dL     Bilirubin, Total 0.3 0.3 - 1.0 mg/dL     Alkaline Phosphatase (ALP) 60 34 - 104 U/L     Aspartate Aminotransferase (AST) 18 13 - 39 U/L     Alanine Aminotransferase (ALT) 13 7 - 52 U/L     Calcium 9.3 8.6 - 10.3 mg/dL     BUN/Creatinine Ratio --    Comment: Creatinine is normal, ratio is not clinically indicated.      CBC with Differential [[id number]]  (Abnormal) Collected: 04/06/24 2014   Lab Status: Final result Specimen: Blood from Venous Updated: 04/06/24 2046    WBC 6.00 4.40 - 11.00 10*3/uL     RBC 4.08* 4.10 - 5.10 10*6/uL     Hemoglobin 13.0 12.3 - 15.3 g/dL     Hematocrit 62.0 64.0 - 44.6 %     Mean Corpuscular Volume (MCV) 92.7 80.0 - 96.0 fL     Mean Corpuscular Hemoglobin (MCH) 31.8 27.5 - 33.2 pg     Mean Corpuscular Hemoglobin Conc (MCHC) 34.3 33.0 - 37.0 g/dL     Red Cell Distribution Width (RDW) 15.4 12.3 - 17.0 %     Platelet Count  (PLT) 206 150 - 450 10*3/uL     Mean Platelet Volume (MPV) 8.8 6.8 - 10.2 fL     Neutrophils % 42 %     Lymphocytes % 45 %     Monocytes % 8 %     Eosinophils % 5 %     Basophils % 0 %     nRBC % 0 %     Neutrophils Absolute 2.50 1.80 - 7.80 10*3/uL     Lymphocytes # 2.70 1.00 - 4.80 10*3/uL     Monocytes # 0.50 0.00 - 0.80 10*3/uL     Eosinophils # 0.30 0.00 - 0.50 10*3/uL     Basophils # 0.00 0.00 - 0.20 10*3/uL     nRBC Absolute 0.00 <=0.00 10*3/uL        Imaging:    CT Abdomen Pelvis W Contrast Narrative: CT ABDOMEN PELVIS W CONTRAST, 04/07/2024 4:06 AM  INDICATION: Abdominal pain, acute, nonlocalized  ADDITIONAL HISTORY: None. COMPARISON: Multiple priors most recent dated 08/27/2023  TECHNIQUE: CT images of the abdomen and pelvis were obtained after intravenous administration of iodinated contrast. Conventional axial reconstructions and multiplanar reformatted images were submitted for review.   FINDINGS:  . Lower Chest: Similar 3 mm left lower lobe pulmonary nodule.  . Liver: No suspicious focal findings. . Gallbladder/Biliary: Unremarkable. SABRA Spleen: Unremarkable. . Pancreas: Unremarkable. . Adrenals:  Unremarkable. . Kidneys: Unremarkable.  . Peritoneum/Mesenteries/Extraperitoneum: No free air. Trace pelvic free fluid, favored physiologic No loculated drainable collection. No pathologically enlarged lymph nodes. . Gastrointestinal tract: No evidence of obstruction. Normal caliber appendix. Moderate colonic stool burden.  . Ureters: Unremarkable. . Bladder: Partially decompressed.. . Reproductive System: Retroverted uterus. Status post left oophorectomy.  . Vascular: Unremarkable. . Musculoskeletal: No acute displaced fractures. Polyarticular degenerative changes. No aggressive focal bony lesions. Abdominal wall soft tissues unremarkable. Impression: No acute findings in the abdomen or pelvis.   Laymon Noreen Campion, MD PGY-1 Obstetrics and  Gynecology [facility] 04/07/24 8:03 AM        [1] Past Medical History: Diagnosis Date  . Allergic rhinitis   . Allergy   . Amenia   . Anemia   . Asthma (CMD)   . Eczema   [2] Past Surgical History: Procedure Laterality Date  . ABDOMINAL SURGERY  N/A 03/25/2023   LAPAROSCOPIC EXPLORATION performed by Comer Earnie Nicholaus Inga, MD at [facility] OR  . DIAGNOSTIC LAPAROSCOPY N/A 08/29/2020   Procedure: Diagnostic laparoscopy, ovarian cystectomy, peritoneal wall biopsy;  Surgeon: Will Gunner, MD;  Location: [facility] MAIN OR;  Service: Gynecology;  Laterality: N/A;  . ENDOMETRIAL ABLATION N/A 03/25/2023   EXCISION Of ENDOMETRIOSIS performed by Comer Earnie Nicholaus Inga, MD at [facility] OR  . OOPHORECTOMY N/A 03/25/2023   LAPAROSCOPIC OOPHORECTOMY performed by Comer Earnie Nicholaus Inga, MD at [facility] OR  . OVARIAN CYST REMOVAL N/A 03/25/2023   CYSTECTOMY OVARIAN performed by Comer Earnie Nicholaus Inga, MD at [facility] OR  . PELVIC LAPAROSCOPY  08/2020   Procedure: PELVIC LAPAROSCOPY  [3] Family History Problem Relation Name Age of Onset  . Hypertension Paternal Grandfather    . Hypertension Maternal Grandmother    . Asthma Maternal Grandmother    . Cancer Maternal Grandmother  92       breast cancer  . Colon cancer Maternal Grandfather    . Hyperlipidemia Maternal Grandfather    . Asthma Father    . Asthma Mother    . Breast cancer Neg Hx    . Ovarian cancer Neg Hx    . Stroke Neg Hx    [4] Allergies Allergen Reactions  . Animal Dander Respiratory Distress    Uses Epipen  . Coconut Swelling  . Kiwi Other (See Comments)    Kiwi fruit- Hives  *Some images could not be shown.

## 2024-04-07 NOTE — Progress Notes (Signed)
 Case Management Discharge Note        CSN: [id number] DOB: 05/20/2000 Service: Emergency Medicine [place]  Patient Class: Emergency  DC Disposition: : Home or Self Care  Discharge DC Disposition: : Home or Self Care Discharge Transport: Cab Discharge Transport Agency Chosen: Tollie Crystal  [phone])  Discharge Referrals Services Provided: Social Worker, Transportation Patient Preference: Chosen geographical local area/county shared with patient/family: Return/previous involvement Patient Preference for Post-Acute Provider Form completed: Return/Previous Involvement Case closed, patient/family agree with disposition plan: Yes       Case Management Coordination Status: Coordination Complete   MSW contacted by RN noting patient in need of transportation assistance.   Patient is ambulatory and oriented.  Patient does not have friends/family able to provide transportation.  Patient does not have funds to pay for transportation. Patient [place] not [place] a [facility] [place]. [facility] is not running at the time of request.   MSW met patient in ED lobby. MSW confirmed address and arranged transportation with [employer].   Robert Lee Jordan, MSW

## 2024-04-07 NOTE — ED Notes (Signed)
 Patient Advocate introduced self to the patient. The patient is requesting pain medication; Medic aware.    Regina Frye [facility] 04/07/24 9195

## 2024-04-07 NOTE — ED Notes (Signed)
 The pt has become quite belligerent and augmentative, the pt is yelling out and cursing at staff. The pt was offered the tylenol  that was ordered and she became increasingly upset    Lamar KATHEE Molly, RN 04/07/24 1435

## 2024-04-08 ENCOUNTER — Other Ambulatory Visit: Payer: Self-pay

## 2024-04-08 ENCOUNTER — Encounter (HOSPITAL_COMMUNITY): Payer: Self-pay

## 2024-04-08 ENCOUNTER — Emergency Department (HOSPITAL_COMMUNITY)
Admission: EM | Admit: 2024-04-08 | Discharge: 2024-04-09 | Disposition: A | Discharge status: Home / Self Care | Attending: Emergency Medicine | Admitting: Emergency Medicine

## 2024-04-08 DIAGNOSIS — R1030 Lower abdominal pain, unspecified: Secondary | ICD-10-CM | POA: Diagnosis present

## 2024-04-08 DIAGNOSIS — J45909 Unspecified asthma, uncomplicated: Secondary | ICD-10-CM | POA: Diagnosis not present

## 2024-04-08 LAB — URINALYSIS, W/ REFLEX TO CULTURE (INFECTION SUSPECTED)
Bilirubin Urine: NEGATIVE
Glucose, UA: NEGATIVE mg/dL
Ketones, ur: NEGATIVE mg/dL
Leukocytes,Ua: NEGATIVE
Nitrite: NEGATIVE
Protein, ur: NEGATIVE mg/dL
Specific Gravity, Urine: 1.011 (ref 1.005–1.030)
pH: 6 (ref 5.0–8.0)

## 2024-04-08 LAB — CBC WITH DIFFERENTIAL/PLATELET
Abs Immature Granulocytes: 0.01 K/uL (ref 0.00–0.07)
Basophils Absolute: 0 K/uL (ref 0.0–0.1)
Basophils Relative: 0 %
Eosinophils Absolute: 0.2 K/uL (ref 0.0–0.5)
Eosinophils Relative: 4 %
HCT: 40.1 % (ref 36.0–46.0)
Hemoglobin: 13.6 g/dL (ref 12.0–15.0)
Immature Granulocytes: 0 %
Lymphocytes Relative: 41 %
Lymphs Abs: 1.9 K/uL (ref 0.7–4.0)
MCH: 31.3 pg (ref 26.0–34.0)
MCHC: 33.9 g/dL (ref 30.0–36.0)
MCV: 92.2 fL (ref 80.0–100.0)
Monocytes Absolute: 0.5 K/uL (ref 0.1–1.0)
Monocytes Relative: 10 %
Neutro Abs: 2.1 K/uL (ref 1.7–7.7)
Neutrophils Relative %: 45 %
Platelets: 213 K/uL (ref 150–400)
RBC: 4.35 MIL/uL (ref 3.87–5.11)
RDW: 14.2 % (ref 11.5–15.5)
WBC: 4.7 K/uL (ref 4.0–10.5)
nRBC: 0 % (ref 0.0–0.2)

## 2024-04-08 LAB — COMPREHENSIVE METABOLIC PANEL WITH GFR
ALT: 15 U/L (ref 0–44)
AST: 26 U/L (ref 15–41)
Albumin: 4 g/dL (ref 3.5–5.0)
Alkaline Phosphatase: 56 U/L (ref 38–126)
Anion gap: 10 (ref 5–15)
BUN: 7 mg/dL (ref 6–20)
CO2: 22 mmol/L (ref 22–32)
Calcium: 9.4 mg/dL (ref 8.9–10.3)
Chloride: 107 mmol/L (ref 98–111)
Creatinine, Ser: 1 mg/dL (ref 0.44–1.00)
GFR, Estimated: >60 mL/min (ref >60)
Glucose, Bld: 117 mg/dL — ABNORMAL HIGH (ref 70–99)
Potassium: 3.5 mmol/L (ref 3.5–5.1)
Sodium: 139 mmol/L (ref 135–145)
Total Bilirubin: 0.6 mg/dL (ref 0.0–1.2)
Total Protein: 7.1 g/dL (ref 6.5–8.1)

## 2024-04-08 LAB — RAPID URINE DRUG SCREEN, HOSP PERFORMED
Amphetamines: NOT DETECTED
Barbiturates: NOT DETECTED
Benzodiazepines: NOT DETECTED
Cocaine: NOT DETECTED
Opiates: POSITIVE — AB
Tetrahydrocannabinol: POSITIVE — AB

## 2024-04-08 MED ORDER — ONDANSETRON HCL 4 MG/2ML IJ SOLN
4.0000 mg | Freq: Once | INTRAMUSCULAR | Status: AC
  Administered 2024-04-08: 4 mg via INTRAVENOUS
  Filled 2024-04-08: qty 2

## 2024-04-08 MED ORDER — KETOROLAC TROMETHAMINE 30 MG/ML IJ SOLN
30.0000 mg | Freq: Once | INTRAMUSCULAR | Status: AC
  Administered 2024-04-08: 30 mg via INTRAVENOUS
  Filled 2024-04-08: qty 1

## 2024-04-08 MED ORDER — SODIUM CHLORIDE 0.9 % IV BOLUS
1000.0000 mL | Freq: Once | INTRAVENOUS | Status: AC
  Administered 2024-04-08: 1000 mL via INTRAVENOUS

## 2024-04-08 NOTE — ED Notes (Signed)
 Pt refusing blood work, stating she only wants to be stuck with an IV.  RN aware.

## 2024-04-08 NOTE — ED Triage Notes (Signed)
 Pt arrived via [facility] c/o lower abd pain x 2 to 3 weeks. 10/10 pain. Pt states that she was recently seen at [facility], but was unahappy with the care because,they were not giving the needed attention and not giving any results or answers.

## 2024-04-08 NOTE — Progress Notes (Signed)
 Situation: Member not enrolled in care navigation services.    Background: Member identified high risk via [facility] with a 04/07/24 [facility] ED visit for suprapubic pain- Zofran  and Flagyl prescribed. Per EMR, [facility] ED 04/01/24 for endometriosis- OBGYN appt 05/16/24.   Assessment: No answer.  Mailbox not setup.   Recommendation: ACN will set a task to make another attempt to member     Nat Lesches RN BSN Ambulatory Care Navigator- [employer] [phone]

## 2024-04-08 NOTE — ED Provider Triage Note (Signed)
 Emergency Medicine Provider Triage Evaluation Note  Regina Frye , a 23 y.o. female  was evaluated in triage.  Pt complains of abdominal pain with nausea and vomiting.  Patient had CT and ultrasound at [facility] yesterday which were unrevealing.  Patient did appear to have bacterial vaginosis and started on Flagyl.  Review of Systems  Positive: Abdominal pain, nausea, vomiting Negative: Fever, chills, diarrhea  Physical Exam  BP 110/60 (BP Location: Right Arm)   Pulse (!) 127   Temp 98.3 F (36.8 C) (Oral)   Resp 20   SpO2 100%  Gen:   Awake, no distress   Resp:  Normal effort  MSK:   Moves extremities without difficulty  Other:  Patient tachycardic and uncomfortable appearing on exam  Medical Decision Making  Medically screening exam initiated at 9:48 PM.  Appropriate orders placed.  Regina Frye was informed that the remainder of the evaluation will be completed by another provider, this initial triage assessment does not replace that evaluation, and the importance of remaining in the ED until their evaluation is complete.  Labs ordered   Francis Ileana SAILOR, PA-C 04/08/24 2151

## 2024-04-08 NOTE — ED Provider Notes (Signed)
 Emergency Department Provider Note   I have reviewed the triage vital signs and the nursing notes.   HISTORY  Chief Complaint Abdominal Pain   HPI Regina Frye is a 23 y.o. female with past history of asthma, endometriosis, prior left oophorectomy presents to the emergency department with continued abdominal pain.  She has had intermittent pain like this over the past 4 years.  She has followed with OB/GYN in the past and been diagnosed with endometriosis.  She states that due to her age they are hesitant to perform a hysterectomy at this time although she has requested it.  She has history of left oophorectomy.  She states over the several weeks she has had intermittent abdominal pain similar to her ongoing discomfort but is led to vomiting.  She was seen in our emergency department twice already this month with CT abdomen pelvis along with pelvic ultrasound.  She was seen in the [facility] system yesterday with negative pregnancy test at that time along with screening blood work.  She is on the Depo shot.  She has established with a GYN locally but appointment is not until next month.  No fevers.  No urinary symptoms.  She states that she did feel woozy and off balance today but that was after taking a family member's muscle relaxer and has since resolved.    Past Medical History:  Diagnosis Date   Asthma     Review of Systems  Constitutional: No fever/chills Cardiovascular: Denies chest pain. Respiratory: Denies shortness of breath. Gastrointestinal: Positive abdominal pain. Positive nausea and vomiting.  No diarrhea.  No constipation. Skin: Negative for rash. Neurological: Negative for headaches.  ____________________________________________   PHYSICAL EXAM:  VITAL SIGNS: ED Triage Vitals  Encounter Vitals Group     BP 04/08/24 2142 110/60     Pulse Rate 04/08/24 2142 (!) 127     Resp 04/08/24 2142 20     Temp 04/08/24 2142 98.3 F (36.8 C)     Temp Source  04/08/24 2142 Oral     SpO2 04/08/24 2142 100 %     Weight 04/08/24 2156 147 lb 0.8 oz (66.7 kg)     Height 04/08/24 2156 5' 7 (1.702 m)   Constitutional: Alert and oriented. Well appearing and in no acute distress. Eyes: Conjunctivae are normal.  Head: Atraumatic. Nose: No congestion/rhinnorhea. Mouth/Throat: Mucous membranes are moist.  Neck: No stridor.   Cardiovascular: Tachycardia. Good peripheral circulation. Grossly normal heart sounds.   Respiratory: Normal respiratory effort.  No retractions. Lungs CTAB. Gastrointestinal: Soft and nontender. No distention.  Musculoskeletal: No gross deformities of extremities. Neurologic:  Normal speech and language.  Skin:  Skin is warm, dry and intact. No rash noted.   ____________________________________________   LABS (all labs ordered are listed, but only abnormal results are displayed)  Labs Reviewed  COMPREHENSIVE METABOLIC PANEL WITH GFR - Abnormal; Notable for the following components:      Result Value   Glucose, Bld 117 (*)    All other components within normal limits  RAPID URINE DRUG SCREEN, HOSP PERFORMED - Abnormal; Notable for the following components:   Opiates POSITIVE (*)    Tetrahydrocannabinol POSITIVE (*)    All other components within normal limits  URINALYSIS, W/ REFLEX TO CULTURE (INFECTION SUSPECTED) - Abnormal; Notable for the following components:   APPearance HAZY (*)    Hgb urine dipstick LARGE (*)    Bacteria, UA FEW (*)    All other components within normal limits  CBC WITH DIFFERENTIAL/PLATELET   ____________________________________________  EKG  *** ____________________________________________  RADIOLOGY  No results found.  ____________________________________________   PROCEDURES  Procedure(s) performed:   Procedures   ____________________________________________   INITIAL IMPRESSION / ASSESSMENT AND PLAN / ED COURSE  Pertinent labs & imaging results that were available  during my care of the patient were reviewed by me and considered in my medical decision making (see chart for details).   This patient is Presenting for Evaluation of ***, which {Range:23949} require a range of treatment options, and {MDMcomplaint:23950} a complaint that involves a {MDMlevelrisk:23951} risk of morbidity and mortality.  The Differential Diagnoses include***.  Critical Interventions-    Medications  sodium chloride  0.9 % bolus 1,000 mL (has no administration in time range)  ketorolac  (TORADOL ) 30 MG/ML injection 30 mg (has no administration in time range)  ondansetron  (ZOFRAN ) injection 4 mg (has no administration in time range)    Reassessment after intervention:     I *** Additional Historical Information from ***, as the patient is ***.  I decided to review pertinent External Data, and in summary ***.   Clinical Laboratory Tests Ordered, included   Radiologic Tests Ordered, included ***. I independently interpreted the images and agree with radiology interpretation.   Cardiac Monitor Tracing which shows ***   Social Determinants of Health Risk ***  Consult complete with  Medical Decision Making: Summary: ***  Reevaluation with update and discussion with   ***Considered admission***  Patient's presentation is most consistent with {EM COPA:27473}   Disposition:   ____________________________________________  FINAL CLINICAL IMPRESSION(S) / ED DIAGNOSES  Final diagnoses:  None     NEW OUTPATIENT MEDICATIONS STARTED DURING THIS VISIT:  New Prescriptions   No medications on file    Note:  This document was prepared using Dragon voice recognition software and may include unintentional dictation errors.  Fonda Law, MD, [facility] Emergency Medicine

## 2024-04-08 NOTE — ED Notes (Signed)
 Pt willing to get labs drawn, only allowing for one attempt. States she will sue the hospital if it isn't successful. Phlebotomist sent in.

## 2024-04-09 MED ORDER — DICYCLOMINE HCL 10 MG PO CAPS
10.0000 mg | ORAL_CAPSULE | Freq: Once | ORAL | Status: AC
  Administered 2024-04-09: 10 mg via ORAL
  Filled 2024-04-09: qty 1

## 2024-04-09 MED ORDER — ONDANSETRON 4 MG PO TBDP: 4.0000 mg | ORAL_TABLET | Freq: Three times a day (TID) | ORAL | 0 refills | Status: AC | PRN

## 2024-04-09 MED ORDER — DICYCLOMINE HCL 20 MG PO TABS: 20.0000 mg | ORAL_TABLET | Freq: Three times a day (TID) | ORAL | 0 refills | Status: AC | PRN

## 2024-04-09 MED ORDER — OXYCODONE-ACETAMINOPHEN 5-325 MG PO TABS
1.0000 | ORAL_TABLET | Freq: Once | ORAL | Status: AC
  Administered 2024-04-09: 1 via ORAL
  Filled 2024-04-09: qty 1

## 2024-04-09 MED ORDER — SENNOSIDES-DOCUSATE SODIUM 8.6-50 MG PO TABS: 1.0000 | ORAL_TABLET | Freq: Every evening | ORAL | 0 refills | Status: AC | PRN

## 2024-04-09 MED ORDER — IBUPROFEN 800 MG PO TABS: 800.0000 mg | ORAL_TABLET | Freq: Three times a day (TID) | ORAL | 0 refills | Status: AC | PRN

## 2024-04-09 MED ORDER — OXYCODONE-ACETAMINOPHEN 5-325 MG PO TABS: 1.0000 | ORAL_TABLET | Freq: Four times a day (QID) | ORAL | 0 refills | Status: AC | PRN

## 2024-04-09 MED ORDER — ONDANSETRON 4 MG PO TBDP
4.0000 mg | ORAL_TABLET | Freq: Once | ORAL | Status: AC
  Administered 2024-04-09: 4 mg via ORAL
  Filled 2024-04-09: qty 1

## 2024-04-09 NOTE — Discharge Instructions (Signed)
 You have been seen in the Emergency Department (ED) for abdominal pain.  Your evaluation did not identify a clear cause of your symptoms but was generally reassuring.  Please follow up as instructed above regarding today's emergent visit and the symptoms that are bothering you. I have listed the name of a Gyn clinic at [facility] and I would like for you to call for the next available appointment.   Return to the ED if your abdominal pain worsens or fails to improve, you develop bloody vomiting, bloody diarrhea, you are unable to tolerate fluids due to vomiting, fever greater than 101, or other symptoms that concern you.

## 2024-04-11 ENCOUNTER — Other Ambulatory Visit: Payer: Self-pay

## 2024-04-11 ENCOUNTER — Ambulatory Visit: Admitting: Physical Medicine and Rehabilitation

## 2024-04-11 ENCOUNTER — Emergency Department (HOSPITAL_BASED_OUTPATIENT_CLINIC_OR_DEPARTMENT_OTHER)
Admission: EM | Admit: 2024-04-11 | Discharge: 2024-04-12 | Disposition: A | Discharge status: Home / Self Care | Attending: Emergency Medicine | Admitting: Emergency Medicine

## 2024-04-11 DIAGNOSIS — J45909 Unspecified asthma, uncomplicated: Secondary | ICD-10-CM | POA: Diagnosis not present

## 2024-04-11 DIAGNOSIS — R1030 Lower abdominal pain, unspecified: Secondary | ICD-10-CM | POA: Diagnosis present

## 2024-04-11 LAB — PREGNANCY, URINE: Preg Test, Ur: NEGATIVE

## 2024-04-11 LAB — URINALYSIS, ROUTINE W REFLEX MICROSCOPIC
Bacteria, UA: NONE SEEN
Bilirubin Urine: NEGATIVE
Glucose, UA: NEGATIVE mg/dL
Ketones, ur: NEGATIVE mg/dL
Leukocytes,Ua: NEGATIVE
Nitrite: NEGATIVE
Protein, ur: NEGATIVE mg/dL
Specific Gravity, Urine: <1.005 — ABNORMAL LOW (ref 1.005–1.030)
pH: 5.5 (ref 5.0–8.0)

## 2024-04-11 LAB — CBC
HCT: 36.1 % (ref 36.0–46.0)
Hemoglobin: 12.4 g/dL (ref 12.0–15.0)
MCH: 31.5 pg (ref 26.0–34.0)
MCHC: 34.3 g/dL (ref 30.0–36.0)
MCV: 91.6 fL (ref 80.0–100.0)
Platelets: 229 K/uL (ref 150–400)
RBC: 3.94 MIL/uL (ref 3.87–5.11)
RDW: 14 % (ref 11.5–15.5)
WBC: 6.7 K/uL (ref 4.0–10.5)
nRBC: 0 % (ref 0.0–0.2)

## 2024-04-11 MED ORDER — ONDANSETRON HCL 4 MG/2ML IJ SOLN
4.0000 mg | Freq: Once | INTRAMUSCULAR | Status: AC
  Administered 2024-04-11: 4 mg via INTRAVENOUS
  Filled 2024-04-11: qty 2

## 2024-04-11 MED ORDER — DROPERIDOL 2.5 MG/ML IJ SOLN
2.5000 mg | Freq: Once | INTRAMUSCULAR | Status: AC
Start: 2024-04-12 — End: 2024-04-11
  Administered 2024-04-11: 2.5 mg via INTRAVENOUS
  Filled 2024-04-11: qty 2

## 2024-04-11 MED ORDER — HYDROMORPHONE HCL 1 MG/ML IJ SOLN
1.0000 mg | Freq: Once | INTRAMUSCULAR | Status: AC
  Administered 2024-04-11: 1 mg via INTRAVENOUS
  Filled 2024-04-11: qty 1

## 2024-04-11 MED ORDER — SODIUM CHLORIDE 0.9 % IV BOLUS
1000.0000 mL | Freq: Once | INTRAVENOUS | Status: AC
  Administered 2024-04-11: 1000 mL via INTRAVENOUS

## 2024-04-11 NOTE — ED Provider Notes (Signed)
 [facility] Emergency Department Provider Note MRN:  [id number]  Arrival date & time: 04/12/24     Chief Complaint   Pelvic Pain   History of Present Illness   Regina Frye is a 23 y.o. year-old female with a history of endometriosis presenting to the ED with chief complaint of pelvic pain.  Worsening of lower abdominal pain today, has been struggling with pain for years.  Feeling nauseated as well.  No fever.  Review of Systems  A thorough review of systems was obtained and all systems are negative except as noted in the HPI and PMH.   Patient's Health History    Past Medical History:  Diagnosis Date   Asthma     Past Surgical History:  Procedure Laterality Date   OVARIAN CYST REMOVAL      No family history on file.  Social History   Socioeconomic History   Marital status: Single    Spouse name: Not on file   Number of children: Not on file   Years of education: Not on file   Highest education level: Not on file  Occupational History   Not on file  Tobacco Use   Smoking status: Never   Smokeless tobacco: Never  Vaping Use   Vaping status: Some Days  Substance and Sexual Activity   Alcohol use: Not Currently   Drug use: Yes    Types: Marijuana   Sexual activity: Not on file  Other Topics Concern   Not on file  Social History Narrative   ** Merged History Encounter **       Social Drivers of Health   Financial Resource Strain: Not on file  Food Insecurity: Low Risk  (12/01/2023)   Received from [facility]   Hunger Vital Sign    Within the past 12 months, you worried that your food would run out before you got money to buy more: Never true    Within the past 12 months, the food you bought just didn't last and you didn't have money to get more. : Patient declined to answer  Transportation Needs: No Transportation Needs (12/01/2023)   Received from [facility]    In the past 12 months, has lack of  reliable transportation kept you from medical appointments, meetings, work or from getting things needed for daily living? : No  Physical Activity: Not on file  Stress: Not on file  Social Connections: Not on file  Intimate Partner Violence: Not At Risk (06/11/2023)   Received from [facility]   HITS    Over the last 12 months how often did your partner physically hurt you?: Never    Over the last 12 months how often did your partner insult you or talk down to you?: Never    Over the last 12 months how often did your partner threaten you with physical harm?: Never    Over the last 12 months how often did your partner scream or curse at you?: Never     Physical Exam   Vitals:   04/11/24 2233 04/12/24 0030  BP: 119/64 108/72  Pulse: (!) 114 81  Resp: 16 16  Temp: 98.8 F (37.1 C) 98.8 F (37.1 C)  SpO2: 97% 96%    CONSTITUTIONAL: Well-appearing, NAD NEURO/PSYCH:  Alert and oriented x 3, no focal deficits EYES:  eyes equal and reactive ENT/NECK:  no LAD, no JVD CARDIO: Tachycardic rate, well-perfused, normal S1 and S2 PULM:  CTAB no wheezing  or rhonchi GI/GU:  non-distended, non-tender MSK/SPINE:  No gross deformities, no edema SKIN:  no rash, atraumatic   *Additional and/or pertinent findings included in MDM below  Diagnostic and Interventional Summary    EKG Interpretation Date/Time:    Ventricular Rate:    PR Interval:    QRS Duration:    QT Interval:    QTC Calculation:   R Axis:      Text Interpretation:         Labs Reviewed  URINALYSIS, ROUTINE W REFLEX MICROSCOPIC - Abnormal; Notable for the following components:      Result Value   Color, Urine COLORLESS (*)    Specific Gravity, Urine <1.005 (*)    Hgb urine dipstick LARGE (*)    All other components within normal limits  COMPREHENSIVE METABOLIC PANEL WITH GFR - Abnormal; Notable for the following components:   CO2 19 (*)    All other components within normal limits  CBC  PREGNANCY, URINE   LIPASE, BLOOD    No orders to display    Medications  ondansetron  (ZOFRAN ) injection 4 mg (4 mg Intravenous Given 04/11/24 2333)  HYDROmorphone  (DILAUDID ) injection 1 mg (1 mg Intravenous Given 04/11/24 2333)  sodium chloride  0.9 % bolus 1,000 mL (0 mLs Intravenous Stopped 04/12/24 0042)  droperidol  (INAPSINE ) 2.5 MG/ML injection 2.5 mg (2.5 mg Intravenous Given 04/11/24 2355)     Procedures  /  Critical Care Procedures  ED Course and Medical Decision Making  Initial Impression and Ddx History of endometriosis, has had surgery for ovary removal.  Multiple recent ER visits with normal imaging via CT and ultrasound.  Patient is tearful and uncomfortable, providing pain control and will reassess.  Past medical/surgical history that increases complexity of ED encounter: Chronic pain  Interpretation of Diagnostics I personally reviewed the Laboratory Testing and my interpretation is as follows: No significant blood count or electrolyte disturbance.    Patient Reassessment and Ultimate Disposition/Management     Minimal response to Dilaudid  for pain.  Excellent response after droperidol .  Patient feels much better, abdomen soft and nontender on reassessment.  No indication for repeat advanced imaging at this time, appropriate for discharge with return precautions.  Patient management required discussion with the following services or consulting groups:  None  Complexity of Problems Addressed Acute illness or injury that poses threat of life of bodily function  Additional Data Reviewed and Analyzed Further history obtained from: Prior labs/imaging results  Additional Factors Impacting ED Encounter Risk Consideration of hospitalization  Ozell HERO. Theadore, MD [facility] Emergency Medicine [facility] [email or web address]  Final Clinical Impressions(s) / ED Diagnoses     ICD-10-CM   1. Lower abdominal pain  R10.30       ED Discharge Orders     None         Discharge Instructions Discussed with and Provided to Patient:     Discharge Instructions      You were evaluated in the Emergency Department and after careful evaluation, we did not find any emergent condition requiring admission or further testing in the hospital.  Your exam/testing today is overall reassuring.  Continue your home medications and follow-up with your regular doctors.  Please return to the Emergency Department if you experience any worsening of your condition.   Thank you for allowing us  to be a part of your care.       Theadore Ozell HERO, MD 04/12/24 [phone]

## 2024-04-11 NOTE — ED Triage Notes (Addendum)
 Pt BIB [facility] reporting persistent lower abd/ R side pelvic pain, recent L ovary removal r/t cyst last year, feels like same on R side.   Fentanyl  and Zofran  given IV PTA

## 2024-04-12 LAB — COMPREHENSIVE METABOLIC PANEL WITH GFR
ALT: 13 U/L (ref 0–44)
AST: 19 U/L (ref 15–41)
Albumin: 4.3 g/dL (ref 3.5–5.0)
Alkaline Phosphatase: 60 U/L (ref 38–126)
Anion gap: 14 (ref 5–15)
BUN: 12 mg/dL (ref 6–20)
CO2: 19 mmol/L — ABNORMAL LOW (ref 22–32)
Calcium: 9.4 mg/dL (ref 8.9–10.3)
Chloride: 107 mmol/L (ref 98–111)
Creatinine, Ser: 0.83 mg/dL (ref 0.44–1.00)
GFR, Estimated: >60 mL/min (ref >60)
Glucose, Bld: 87 mg/dL (ref 70–99)
Potassium: 3.9 mmol/L (ref 3.5–5.1)
Sodium: 140 mmol/L (ref 135–145)
Total Bilirubin: 0.3 mg/dL (ref 0.0–1.2)
Total Protein: 6.9 g/dL (ref 6.5–8.1)

## 2024-04-12 LAB — LIPASE, BLOOD: Lipase: 37 U/L (ref 11–51)

## 2024-04-12 NOTE — Discharge Instructions (Signed)
 You were evaluated in the Emergency Department and after careful evaluation, we did not find any emergent condition requiring admission or further testing in the hospital.  Your exam/testing today is overall reassuring.  Continue your home medications and follow-up with your regular doctors.  Please return to the Emergency Department if you experience any worsening of your condition.   Thank you for allowing [place] to be a part of your care.

## 2024-04-12 NOTE — ED Notes (Signed)
Pt refused vitals at discharge. 

## 2024-05-10 ENCOUNTER — Other Ambulatory Visit: Payer: Self-pay

## 2024-05-16 ENCOUNTER — Encounter: Admitting: Obstetrics and Gynecology
